# Patient Record
Sex: Male | Born: 1960 | Race: White | Hispanic: No | Marital: Married | Smoking: Former smoker
Health system: Southern US, Community
[De-identification: ages and names within clinical notes are randomized; demographics above are authoritative.]

## PROBLEM LIST (undated history)

## (undated) DIAGNOSIS — M1990 Unspecified osteoarthritis, unspecified site: Secondary | ICD-10-CM

## (undated) DIAGNOSIS — J45909 Unspecified asthma, uncomplicated: Secondary | ICD-10-CM

## (undated) DIAGNOSIS — G4730 Sleep apnea, unspecified: Secondary | ICD-10-CM

## (undated) DIAGNOSIS — I10 Essential (primary) hypertension: Secondary | ICD-10-CM

## (undated) DIAGNOSIS — R0681 Apnea, not elsewhere classified: Secondary | ICD-10-CM

## (undated) HISTORY — DX: Unspecified asthma, uncomplicated: J45.909

## (undated) HISTORY — PX: REPLACEMENT TOTAL HIP W/  RESURFACING IMPLANTS: SUR1222

## (undated) HISTORY — DX: Essential (primary) hypertension: I10

## (undated) HISTORY — DX: Apnea, not elsewhere classified: R06.81

## (undated) HISTORY — PX: OTHER SURGICAL HISTORY: SHX169

## (undated) HISTORY — PX: HERNIA REPAIR: SHX51

---

## 2017-02-16 ENCOUNTER — Other Ambulatory Visit (HOSPITAL_COMMUNITY): Payer: Self-pay | Admitting: General Surgery

## 2017-03-01 ENCOUNTER — Encounter: Payer: Managed Care, Other (non HMO) | Attending: General Surgery | Admitting: Skilled Nursing Facility1

## 2017-03-01 ENCOUNTER — Encounter: Payer: Self-pay | Admitting: Skilled Nursing Facility1

## 2017-03-01 DIAGNOSIS — Z713 Dietary counseling and surveillance: Secondary | ICD-10-CM | POA: Diagnosis present

## 2017-03-01 DIAGNOSIS — E669 Obesity, unspecified: Secondary | ICD-10-CM

## 2017-03-01 NOTE — Progress Notes (Addendum)
Pre-Op Assessment Visit:  Pre-Operative Sleeve Gastrectomy Surgery  Medical Nutrition Therapy:  Appt start time: 2:48  End time:  4:03  Patient was seen on 03/01/2017 for Pre-Operative Nutrition Assessment. Assessment and letter of approval [facility] coordinator on 03/01/2017.   Pt is keeping up with the baritastic app. Pt states he does not eat fruits or vegetables. Pt states he needs 3 SWL.  Pt expectation of surgery: To lose wt and feel better  Pt expectation of Dietitian: To keep me straight   Start weight at [facility]: 390.2 BMI: 52.92  24 hr Dietary Recall: First Meal: protein shake Snack:  Second Meal: protein bar, cheese crackers, protein shake Snack: cheese crackers  Third Meal: fast food Snack: protein bar Beverages: coffee, protein shake, V8 vegetable juice, sweet tea, grape juice  Encouraged to engage in 150 minutes of moderate physical activity including cardiovascular and weight baring weekly  Handouts given during visit include:  . Pre-Op Goals . Bariatric Surgery Protein Shakes During the appointment today the following Pre-Op Goals were reviewed with the patient: . Maintain or lose weight as instructed by your surgeon . Make healthy food choices . Begin to limit portion sizes . Limited concentrated sugars and fried foods . Keep fat/sugar in the single digits per serving on             food labels . Practice CHEWING your food  (aim for 30 chews per bite or until applesauce consistency) . Practice not drinking 15 minutes before, during, and 30 minutes after each meal/snack . Avoid all carbonated beverages  . Avoid/limit caffeinated beverages  . Avoid all sugar-sweetened beverages . Consume 3 meals per day; eat every 3-5 hours . Make a list of non-food related activities . Aim for 64-100 ounces of FLUID daily  . Aim for at least 60-80 grams of PROTEIN daily . Look for a liquid protein source that contain ?15 g  protein and ?5 g carbohydrate  (ex: shakes, drinks, shots) . Eat shredded carrots once a week  -Follow diet recommendations listed below   Energy and Macronutrient Recomendations: Calories: 2000 Carbohydrate: 225 Protein: 150 Fat: 56  Demonstrated degree of understanding via:  Teach Back  Teaching Method Utilized:  Visual Auditory Hands on  Barriers to learning/adherence to lifestyle change: none identified   Patient to call the [facility] to enroll in Pre-Op and Post-Op Nutrition Education when surgery date is scheduled.

## 2017-03-23 ENCOUNTER — Ambulatory Visit (HOSPITAL_COMMUNITY)
Admission: RE | Admit: 2017-03-23 | Discharge: 2017-03-23 | Disposition: A | Discharge status: Home / Self Care | Payer: Managed Care, Other (non HMO) | Source: Ambulatory Visit | Attending: General Surgery | Admitting: General Surgery

## 2017-03-23 ENCOUNTER — Other Ambulatory Visit: Payer: Self-pay

## 2017-03-23 DIAGNOSIS — R001 Bradycardia, unspecified: Secondary | ICD-10-CM | POA: Insufficient documentation

## 2017-03-28 ENCOUNTER — Encounter: Payer: Managed Care, Other (non HMO) | Attending: General Surgery | Admitting: Skilled Nursing Facility1

## 2017-03-28 ENCOUNTER — Encounter: Payer: Self-pay | Admitting: Skilled Nursing Facility1

## 2017-03-28 DIAGNOSIS — Z713 Dietary counseling and surveillance: Secondary | ICD-10-CM | POA: Insufficient documentation

## 2017-03-28 DIAGNOSIS — E669 Obesity, unspecified: Secondary | ICD-10-CM

## 2017-03-28 NOTE — Patient Instructions (Addendum)
-  Chew until applesauce consistency: try putting your fork down in between bites  -Try more of your melon, grapes, and carrots  -Do not get anything fried   -Work on the meal idea sheet with your wife

## 2017-03-28 NOTE — Progress Notes (Signed)
Sleeve Assessment:   1st SWL Appointment.   Pt is keeping up with the baritastic app. Pt states he does not eat fruits or vegetables. Pt states he needs 3 SWL.  Pt states he tried watermelon, carrots, cantelope, honeydew, grapes, greek yogurt. Pt states he bought premier protein and will try it. Pt states he was not able to exercise because he was busy.   Start weight at [facility]: 390.2 Wt: 395 BMI: 53.57  MEDICATIONS: See List   DIETARY INTAKE:  24-hr recall:  First Meal 5am: protein shake Snack: 2 poptarts Second Meal: protein bar, cheese crackers, protein shake, almonds Snack: cheese crackers  Third Meal: fast food Snack: protein bar Beverages: coffee, protein shake, V8 vegetable juice, sweet tea, grape juice  Usual physical activity: ADL's  Diet to Follow: 1800 calories 200 g carbohydrates 135 g protein 50 g fat   Nutritional Diagnosis:  [place]-3.3 Overweight/obesity related to past poor dietary habits and physical inactivity as evidenced by patient w/ planned Sleeve surgery following dietary guidelines for continued weight loss.    Intervention:  Nutrition counseling for upcoming Bariatric Surgery. Goals: -Encouraged to engage in 150 minutes of moderate physical activity including cardiovascular and weight baring weekly -Chew until applesauce consistency: try putting your fork down in between bites -Try more of your melon, grapes, and carrots -Do not get anything fried  -Work on the meal idea sheet with your wife  Teaching Method Utilized:  Visual Auditory Hands on  Handouts given during visit include:  Meal ideas  Barriers to learning/adherence to lifestyle change: none identified   Demonstrated degree of understanding via:  Teach Back   Monitoring/Evaluation:  Dietary intake, exercise, and body weight prn.

## 2017-04-24 ENCOUNTER — Ambulatory Visit (INDEPENDENT_AMBULATORY_CARE_PROVIDER_SITE_OTHER): Payer: 59 | Admitting: Psychiatry

## 2017-04-24 DIAGNOSIS — F509 Eating disorder, unspecified: Secondary | ICD-10-CM | POA: Diagnosis not present

## 2017-04-26 ENCOUNTER — Encounter: Payer: Self-pay | Admitting: Skilled Nursing Facility1

## 2017-04-26 ENCOUNTER — Encounter: Payer: Managed Care, Other (non HMO) | Attending: General Surgery | Admitting: Skilled Nursing Facility1

## 2017-04-26 DIAGNOSIS — Z713 Dietary counseling and surveillance: Secondary | ICD-10-CM | POA: Insufficient documentation

## 2017-04-26 DIAGNOSIS — E669 Obesity, unspecified: Secondary | ICD-10-CM

## 2017-04-26 NOTE — Progress Notes (Signed)
Sleeve Assessment:   2nd SWL Appointment.   Pt is keeping up with the baritastic app. Pt states he does not eat fruits or vegetables. Pt states he needs 3 SWL.  Pt arrives having lost about 5 pounds. Pt states he thinks he will eat more oranges pt thinks his wife will be making him try apples soon. Pt states he has been eating some carrots every now and then. Pt states chewing has been better but still not accomplished. Pt states he no longer needs another SWL appointment.   Start weight at [facility]: 390.2 Wt: 390 BMI: 52.89  MEDICATIONS: See List   DIETARY INTAKE:  24-hr recall:  First Meal 5am: protein shake and granol bar Snack: greek yogurt and crackers Second Meal: protein bar, cheese crackers, protein shake, almonds----half a turkey sub and chips and cookie [place] grapes  Snack: cheese crackers  Third Meal: chicken strips french fries and hush puppies  Snack: protein bar Beverages: coffee, protein shake, V8 vegetable juice, sweet tea, grape juice  Usual physical activity: ADL's  Diet to Follow: 1800 calories 200 g carbohydrates 135 g protein 50 g fat   Nutritional Diagnosis:  [place]-3.3 Overweight/obesity related to past poor dietary habits and physical inactivity as evidenced by patient w/ planned Sleeve surgery following dietary guidelines for continued weight loss.    Intervention:  Nutrition counseling for upcoming Bariatric Surgery. Goals: -Keep working on Chewing until applesauce consistency: try putting your fork down in between bites -Keep up the great work with trying new foods! -Do not get anything fried  -Work on the meal idea sheet with your wife -Keep working on not drinking with meals  Teaching Method Utilized:  Scientific laboratory technicianVisual Auditory Hands on  Handouts given during visit include:  Meal ideas  Barriers to learning/adherence to lifestyle change: none identified   Demonstrated degree of understanding via:  Teach Back   Monitoring/Evaluation:  Dietary intake,  exercise, and body weight prn.

## 2017-04-26 NOTE — Patient Instructions (Addendum)
-  Keep working on Chewing until applesauce consistency: try putting your fork down in between bites  -Keep up the great work with trying new foods!  -Do not get anything fried   -Work on the meal idea sheet with your wife  -Keep working on not drinking with meals

## 2017-05-28 ENCOUNTER — Ambulatory Visit: Payer: Self-pay

## 2017-05-28 ENCOUNTER — Encounter: Payer: Self-pay | Admitting: Registered"

## 2017-05-28 ENCOUNTER — Encounter: Payer: Managed Care, Other (non HMO) | Admitting: Skilled Nursing Facility1

## 2017-05-28 ENCOUNTER — Encounter: Payer: Managed Care, Other (non HMO) | Attending: General Surgery | Admitting: Registered"

## 2017-05-28 DIAGNOSIS — E669 Obesity, unspecified: Secondary | ICD-10-CM

## 2017-05-28 DIAGNOSIS — Z713 Dietary counseling and surveillance: Secondary | ICD-10-CM | POA: Diagnosis not present

## 2017-05-28 NOTE — Progress Notes (Signed)
Sleeve Assessment: 3rd SWL Appointment.   Pt is keeping up with the baritastic app. Pt states he does not eat fruits or vegetables. Pt states he needs 3 SWL.  Pt arrives having gained about 4 pounds. Pt states he is doing good most of the time with trying not to eat fried foods. Pt states he is still working on chewing well, forgets sometimes when eating in the truck before work.  Pt states he is doing better with not drinking with meals. Pt states his surgery date 12/18.   Pt states he thinks he will eat more oranges pt thinks his wife will be making him try apples soon. Pt states he has been eating some carrots every now and then. Pt states chewing has been better but still not accomplished. Pt states he no longer needs another SWL appointment.   Start weight at [facility]: 390.2 Wt: 394.2 BMI: 53.46  MEDICATIONS: See List   DIETARY INTAKE:  24-hr recall:  First Meal 5am: protein shake and granol bar Snack: greek yogurt and crackers Second Meal: protein bar, cheese crackers, protein shake, almonds----half a [place] sub and chips and cookie and grapes  Snack: cheese crackers  Third Meal: chicken strips french fries and hush puppies  Snack: protein bar Beverages: decaf coffee, protein shake, V8 vegetable juice, sweet tea, grape juice  Usual physical activity: ADL's, swimming 60 min, 3x/week  Diet to Follow: 1800 calories 200 g carbohydrates 135 g protein 50 g fat   Nutritional Diagnosis:  -3.3 Overweight/obesity related to past poor dietary habits and physical inactivity as evidenced by patient w/ planned Sleeve surgery following dietary guidelines for continued weight loss.    Intervention:  Nutrition counseling for upcoming Bariatric Surgery. Goals: - Continue to work on chewing at least 30 times per bite or applesauce consistency.  - Keep working on not drinking with meals.  - Take multivitamin complete with iron and Vitamin D 5000 IU.   Teaching Method Utilized:   Visual Auditory Hands on  Handouts given during visit include:  Vitamin and Mineral Recommendations  Barriers to learning/adherence to lifestyle change: none identified   Demonstrated degree of understanding via:  Teach Back   Monitoring/Evaluation:  Dietary intake, exercise, and body weight prn.

## 2017-05-28 NOTE — Patient Instructions (Addendum)
-   Continue to work on chewing at least 30 times per bite or applesauce consistency.   - Keep working on not drinking with meals.   - Take multivitamin complete with iron and Vitamin D 5000 IU.

## 2017-05-29 ENCOUNTER — Encounter: Payer: Self-pay | Admitting: Skilled Nursing Facility1

## 2017-05-29 NOTE — Progress Notes (Signed)
Pre-Operative Nutrition Class:  Appt start time: 2111   End time:  1830.  Patient was seen on 05/28/2017 for Pre-Operative Bariatric Surgery Education [facility].   Surgery date:  Surgery type: Sleeve Start weight at [facility]: 390 Weight today: 394  Samples given per MNT protocol. Patient educated on appropriate usage: Bariatric Advantage Multivitamin Lot # [id number] Exp: 07/19  Bariatric Advantage Calcium  Lot # [id number] Exp: 04-05-2018  Unjury Protein  Shake Lot # [id number] Exp: may-28-19  The following the learning objectives were met by the patient during this course:  Identify Pre-Op Dietary Goals and will begin 2 weeks pre-operatively  Identify appropriate sources of fluids and proteins   State protein recommendations and appropriate sources pre and post-operatively  Identify Post-Operative Dietary Goals and will follow for 2 weeks post-operatively  Identify appropriate multivitamin and calcium sources  Describe the need for physical activity post-operatively and will follow MD recommendations  State when to call healthcare provider regarding medication questions or post-operative complications  Handouts given during class include:  Pre-Op Bariatric Surgery Diet Handout  Protein Shake Handout  Post-Op Bariatric Surgery Nutrition Handout  BELT Program Information Flyer  Support Group Information Flyer  [facility] Bariatric Supplements Price List  Follow[facility]Up Plan: [facility] diet [facility] per MD.

## 2017-05-31 ENCOUNTER — Ambulatory Visit (INDEPENDENT_AMBULATORY_CARE_PROVIDER_SITE_OTHER): Payer: 59 | Admitting: Psychiatry

## 2017-05-31 DIAGNOSIS — F509 Eating disorder, unspecified: Secondary | ICD-10-CM

## 2017-06-06 ENCOUNTER — Telehealth: Payer: Self-pay | Admitting: Skilled Nursing Facility1

## 2017-06-06 NOTE — Telephone Encounter (Signed)
Pt had questions about the vitamins and when to get them.  Dietitian advised he go ahead and buy them now so he will have them for after surgery.

## 2017-06-14 ENCOUNTER — Telehealth: Payer: Self-pay | Admitting: Skilled Nursing Facility1

## 2017-06-14 NOTE — Telephone Encounter (Signed)
No that one does not have enough of what you need. Really, no multivitamin from [facility] is going to have what you need. Your best option is to get the ones from your list online.   From: Lajean Manesoger Schroll [email or web address]  Sent: Thursday, June 14, 2017 3:39 PM To: Demario [email or web address]> Subject: [External Email]Bariatric vitamins  *Caution - External Email*  Just checking to see if these vitamins would be good for my bariatric Vitamins             Sent from my iPhone

## 2017-06-15 NOTE — Progress Notes (Signed)
Please place orders in epic pt. Has a preop 06-18-17 @ 1100 am thank you!

## 2017-06-15 NOTE — Patient Instructions (Addendum)
Omar ManesRoger Wang  06/15/2017   Your procedure is scheduled on: 06-19-17  [facility]  Entrance [facility] [place]  elevators to 3rd floor to  Short Stay Center at      1215 PM.    Call this number if you have problems the morning of surgery [phone]    Remember: ONLY 1 PERSON MAY GO WITH YOU TO SHORT STAY TO GET  READY MORNING OF YOUR SURGERY.   Do not eat food:After Midnight.  YOU MAY HAVE CLEAR LIQUIDS UNTIL 815 THEN NOTHING BY MOUTH     Take these medicines the morning of surgery with A SIP OF WATER: INHALER AND BRING                               You may not have any metal on your body including hair pins and              piercings  Do not wear jewelry,lotions, powders or perfumes, deodorant     .              Men may shave face and neck.   Do not bring valuables to the hospital. [facility] IS NOT             RESPONSIBLE   FOR VALUABLES.  Contacts, dentures or bridgework may not be worn into surgery.  Leave suitcase in the car. After surgery it may be brought to your room.               Please read over the following fact sheets you were given: _____________________________________________________________________           [facility] - Pre[facility] Before surgery, you can play an important role.  Because skin is not sterile, your skin needs to be as free of germs as possible.  You can reduce the number of germs on your skin by washing with CHG (chlorahexidine gluconate) soap before surgery.  CHG is an antiseptic cleaner which kills germs and bonds with the skin to continue killing germs even after washing. Please DO NOT use if you have an allergy to CHG or antibacterial soaps.  If your skin becomes reddened/irritated stop using the CHG and inform your nurse when you arrive at Short Stay. Do not shave (including legs and underarms) for at least 48 hours prior to the first CHG shower.  You may shave your face/neck. Please follow these  instructions carefully:  1.  Shower with CHG Soap the night before surgery and the  morning of Surgery.  2.  If you choose to wash your hair, wash your hair first as usual with your  normal  shampoo.  3.  After you shampoo, rinse your hair and body thoroughly to remove the  shampoo.                           4.  Use CHG as you would any other liquid soap.  You can apply chg directly  to the skin and wash                       Gently with a scrungie or clean washcloth.  5.  Apply the CHG Soap to your body ONLY FROM THE NECK DOWN.   Do not  use on face/ open                           Wound or open sores. Avoid contact with eyes, ears mouth and genitals (private parts).                       Wash face,  Genitals (private parts) with your normal soap.             6.  Wash thoroughly, paying special attention to the area where your surgery  will be performed.  7.  Thoroughly rinse your body with warm water from the neck down.  8.  DO NOT shower/wash with your normal soap after using and rinsing off  the CHG Soap.                9.  Pat yourself dry with a clean towel.            10.  Wear clean pajamas.            11.  Place clean sheets on your bed the night of your first shower and do not  sleep with pets. Day of Surgery : Do not apply any lotions/deodorants the morning of surgery.  Please wear clean clothes to the hospital/surgery center.  FAILURE TO FOLLOW THESE INSTRUCTIONS MAY RESULT IN THE CANCELLATION OF YOUR SURGERY PATIENT SIGNATURE_________________________________  NURSE SIGNATURE__________________________________  ________________________________________________________________________    CLEAR LIQUID DIET   Foods Allowed                                                                     Foods Excluded  Coffee and tea, regular and decaf                             liquids that you cannot  Plain Jell-O in any flavor                                             see through such  as: Fruit ices (not with fruit pulp)                                     milk, soups, orange juice  Iced Popsicles                                    All solid food Carbonated beverages, regular and diet                                    Cranberry, grape and apple juices Sports drinks like Gatorade Lightly seasoned clear broth or consume(fat free) Sugar, honey syrup  Sample Menu Breakfast  Lunch                                     Supper Cranberry juice                    Beef broth                            Chicken broth Jell-O                                     Grape juice                           Apple juice Coffee or tea                        Jell-O                                      Popsicle                                                Coffee or tea                        Coffee or tea  _____________________________________________________________________

## 2017-06-18 ENCOUNTER — Encounter (HOSPITAL_COMMUNITY)
Admission: RE | Admit: 2017-06-18 | Discharge: 2017-06-18 | Disposition: A | Discharge status: Home / Self Care | Payer: Managed Care, Other (non HMO) | Source: Ambulatory Visit | Attending: General Surgery | Admitting: General Surgery

## 2017-06-18 ENCOUNTER — Other Ambulatory Visit: Payer: Self-pay

## 2017-06-18 ENCOUNTER — Encounter (HOSPITAL_COMMUNITY): Payer: Self-pay

## 2017-06-18 HISTORY — DX: Sleep apnea, unspecified: G47.30

## 2017-06-18 HISTORY — DX: Unspecified osteoarthritis, unspecified site: M19.90

## 2017-06-18 NOTE — Progress Notes (Signed)
ekg 03-23-17 epic  Cbc/diff 05-31-17 cmp 05-31-17, hgba1c 05-31-17 epic   cxr 03-23-17

## 2017-06-18 NOTE — Progress Notes (Signed)
Requested bari bed for 06-20-17 @1215  ss 3rd floor

## 2017-06-19 ENCOUNTER — Inpatient Hospital Stay (HOSPITAL_COMMUNITY): Payer: Managed Care, Other (non HMO) | Admitting: Anesthesiology

## 2017-06-19 ENCOUNTER — Encounter (HOSPITAL_COMMUNITY): Payer: Self-pay | Admitting: *Deleted

## 2017-06-19 ENCOUNTER — Inpatient Hospital Stay (HOSPITAL_COMMUNITY)
Admission: RE | Admit: 2017-06-19 | Discharge: 2017-06-20 | DRG: 621 | Disposition: A | Discharge status: Home / Self Care | Payer: Managed Care, Other (non HMO) | Source: Ambulatory Visit | Attending: General Surgery | Admitting: General Surgery

## 2017-06-19 ENCOUNTER — Encounter (HOSPITAL_COMMUNITY)
Admission: RE | Disposition: A | Discharge status: Home / Self Care | Payer: Self-pay | Source: Ambulatory Visit | Attending: General Surgery

## 2017-06-19 DIAGNOSIS — Z01811 Encounter for preprocedural respiratory examination: Secondary | ICD-10-CM | POA: Diagnosis not present

## 2017-06-19 DIAGNOSIS — Z96643 Presence of artificial hip joint, bilateral: Secondary | ICD-10-CM | POA: Diagnosis present

## 2017-06-19 DIAGNOSIS — Z7951 Long term (current) use of inhaled steroids: Secondary | ICD-10-CM

## 2017-06-19 DIAGNOSIS — Z6841 Body Mass Index (BMI) 40.0 and over, adult: Secondary | ICD-10-CM

## 2017-06-19 DIAGNOSIS — I10 Essential (primary) hypertension: Secondary | ICD-10-CM | POA: Diagnosis present

## 2017-06-19 DIAGNOSIS — Z885 Allergy status to narcotic agent status: Secondary | ICD-10-CM

## 2017-06-19 DIAGNOSIS — G4730 Sleep apnea, unspecified: Secondary | ICD-10-CM | POA: Diagnosis present

## 2017-06-19 DIAGNOSIS — Z87891 Personal history of nicotine dependence: Secondary | ICD-10-CM | POA: Diagnosis not present

## 2017-06-19 DIAGNOSIS — K660 Peritoneal adhesions (postprocedural) (postinfection): Secondary | ICD-10-CM | POA: Diagnosis present

## 2017-06-19 DIAGNOSIS — J45909 Unspecified asthma, uncomplicated: Secondary | ICD-10-CM | POA: Diagnosis present

## 2017-06-19 HISTORY — PX: LAPAROSCOPIC GASTRIC SLEEVE RESECTION: SHX5895

## 2017-06-19 LAB — COMPREHENSIVE METABOLIC PANEL
ALT: 69 U/L — ABNORMAL HIGH (ref 17–63)
ANION GAP: 9 (ref 5–15)
AST: 45 U/L — ABNORMAL HIGH (ref 15–41)
Albumin: 4.4 g/dL (ref 3.5–5.0)
Alkaline Phosphatase: 82 U/L (ref 38–126)
BUN: 16 mg/dL (ref 6–20)
CHLORIDE: 103 mmol/L (ref 101–111)
CO2: 24 mmol/L (ref 22–32)
CREATININE: 0.87 mg/dL (ref 0.61–1.24)
Calcium: 9.4 mg/dL (ref 8.9–10.3)
Glucose, Bld: 89 mg/dL (ref 65–99)
POTASSIUM: 4 mmol/L (ref 3.5–5.1)
Sodium: 136 mmol/L (ref 135–145)
Total Bilirubin: 1.4 mg/dL — ABNORMAL HIGH (ref 0.3–1.2)
Total Protein: 7.5 g/dL (ref 6.5–8.1)

## 2017-06-19 LAB — CBC WITH DIFFERENTIAL/PLATELET
BASOS ABS: 0 10*3/uL (ref 0.0–0.1)
BASOS PCT: 0 %
EOS PCT: 3 %
Eosinophils Absolute: 0.2 10*3/uL (ref 0.0–0.7)
HCT: 45.1 % (ref 39.0–52.0)
Hemoglobin: 15.1 g/dL (ref 13.0–17.0)
Lymphocytes Relative: 24 %
Lymphs Abs: 1.3 10*3/uL (ref 0.7–4.0)
MCH: 30.9 pg (ref 26.0–34.0)
MCHC: 33.5 g/dL (ref 30.0–36.0)
MCV: 92.4 fL (ref 78.0–100.0)
MONO ABS: 0.5 10*3/uL (ref 0.1–1.0)
Monocytes Relative: 10 %
Neutro Abs: 3.5 10*3/uL (ref 1.7–7.7)
Neutrophils Relative %: 63 %
PLATELETS: 170 10*3/uL (ref 150–400)
RBC: 4.88 MIL/uL (ref 4.22–5.81)
RDW: 13.4 % (ref 11.5–15.5)
WBC: 5.5 10*3/uL (ref 4.0–10.5)

## 2017-06-19 LAB — TYPE AND SCREEN
ABO/RH(D): A POS
ANTIBODY SCREEN: NEGATIVE

## 2017-06-19 LAB — HEMOGLOBIN AND HEMATOCRIT, BLOOD
HEMATOCRIT: 43.5 % (ref 39.0–52.0)
HEMOGLOBIN: 14.3 g/dL (ref 13.0–17.0)

## 2017-06-19 LAB — GLUCOSE, CAPILLARY: GLUCOSE-CAPILLARY: 82 mg/dL (ref 65–99)

## 2017-06-19 LAB — ABO/RH: ABO/RH(D): A POS

## 2017-06-19 SURGERY — GASTRECTOMY, SLEEVE, LAPAROSCOPIC
Anesthesia: General | Site: Abdomen

## 2017-06-19 MED ORDER — ACETAMINOPHEN 500 MG PO TABS
1000.0000 mg | ORAL_TABLET | ORAL | Status: AC
  Administered 2017-06-19: 1000 mg via ORAL
  Filled 2017-06-19: qty 2

## 2017-06-19 MED ORDER — GABAPENTIN 300 MG PO CAPS
300.0000 mg | ORAL_CAPSULE | ORAL | Status: AC
  Administered 2017-06-19: 300 mg via ORAL
  Filled 2017-06-19: qty 1

## 2017-06-19 MED ORDER — PANTOPRAZOLE SODIUM 40 MG IV SOLR
40.0000 mg | Freq: Every day | INTRAVENOUS | Status: DC
  Administered 2017-06-19: 40 mg via INTRAVENOUS
  Filled 2017-06-19: qty 40

## 2017-06-19 MED ORDER — CHLORHEXIDINE GLUCONATE 4 % EX LIQD
60.0000 mL | Freq: Once | CUTANEOUS | Status: AC
  Administered 2017-06-19: 4 via TOPICAL

## 2017-06-19 MED ORDER — SODIUM CHLORIDE 0.9 % IV SOLN
INTRAVENOUS | Status: DC
  Administered 2017-06-19 – 2017-06-20 (×2): via INTRAVENOUS

## 2017-06-19 MED ORDER — FENTANYL CITRATE (PF) 100 MCG/2ML IJ SOLN
INTRAMUSCULAR | Status: DC | PRN
  Administered 2017-06-19: 100 ug via INTRAVENOUS
  Administered 2017-06-19: 50 ug via INTRAVENOUS
  Administered 2017-06-19: 100 ug via INTRAVENOUS

## 2017-06-19 MED ORDER — ONDANSETRON HCL 4 MG/2ML IJ SOLN
4.0000 mg | INTRAMUSCULAR | Status: DC | PRN
Start: 2017-06-19 — End: 2017-06-20

## 2017-06-19 MED ORDER — PROMETHAZINE HCL 25 MG/ML IJ SOLN
INTRAMUSCULAR | Status: AC
  Filled 2017-06-19: qty 1

## 2017-06-19 MED ORDER — BUPIVACAINE-EPINEPHRINE (PF) 0.25% -1:200000 IJ SOLN
INTRAMUSCULAR | Status: AC
  Filled 2017-06-19: qty 30

## 2017-06-19 MED ORDER — TRAMADOL HCL 50 MG PO TABS: 50.0000 mg | ORAL_TABLET | Freq: Four times a day (QID) | ORAL | Status: DC | PRN

## 2017-06-19 MED ORDER — BUPIVACAINE LIPOSOME 1.3 % IJ SUSP
20.0000 mL | Freq: Once | INTRAMUSCULAR | Status: AC
  Administered 2017-06-19: 20 mL
  Filled 2017-06-19: qty 20

## 2017-06-19 MED ORDER — MIDAZOLAM HCL 5 MG/5ML IJ SOLN
INTRAMUSCULAR | Status: DC | PRN
  Administered 2017-06-19: 2 mg via INTRAVENOUS

## 2017-06-19 MED ORDER — BUDESONIDE 0.25 MG/2ML IN SUSP
0.2500 mg | Freq: Two times a day (BID) | RESPIRATORY_TRACT | Status: DC
  Administered 2017-06-19 – 2017-06-20 (×2): 0.25 mg via RESPIRATORY_TRACT
  Filled 2017-06-19 (×2): qty 2

## 2017-06-19 MED ORDER — LIDOCAINE 2% (20 MG/ML) 5 ML SYRINGE
INTRAMUSCULAR | Status: AC
  Filled 2017-06-19: qty 5

## 2017-06-19 MED ORDER — PROPOFOL 10 MG/ML IV BOLUS
INTRAVENOUS | Status: DC | PRN
  Administered 2017-06-19: 160 mg via INTRAVENOUS

## 2017-06-19 MED ORDER — PROMETHAZINE HCL 25 MG/ML IJ SOLN
6.2500 mg | INTRAMUSCULAR | Status: AC | PRN
  Administered 2017-06-19 (×2): 12.5 mg via INTRAVENOUS

## 2017-06-19 MED ORDER — MEPERIDINE HCL 50 MG/ML IJ SOLN: 6.2500 mg | INTRAMUSCULAR | Status: DC | PRN

## 2017-06-19 MED ORDER — FENTANYL CITRATE (PF) 250 MCG/5ML IJ SOLN
INTRAMUSCULAR | Status: AC
  Filled 2017-06-19: qty 5

## 2017-06-19 MED ORDER — CHLORHEXIDINE GLUCONATE 4 % EX LIQD
60.0000 mL | Freq: Once | CUTANEOUS | Status: DC
Start: 2017-06-20 — End: 2017-06-19

## 2017-06-19 MED ORDER — PROPOFOL 10 MG/ML IV BOLUS
INTRAVENOUS | Status: AC
  Filled 2017-06-19: qty 20

## 2017-06-19 MED ORDER — LACTATED RINGERS IR SOLN
Status: DC | PRN
  Administered 2017-06-19: 1000 mL

## 2017-06-19 MED ORDER — APREPITANT 40 MG PO CAPS
40.0000 mg | ORAL_CAPSULE | ORAL | Status: AC
  Administered 2017-06-19: 40 mg via ORAL
  Filled 2017-06-19: qty 1

## 2017-06-19 MED ORDER — HYDROMORPHONE HCL 1 MG/ML IJ SOLN
INTRAMUSCULAR | Status: AC
  Filled 2017-06-19: qty 2

## 2017-06-19 MED ORDER — ACETAMINOPHEN 160 MG/5ML PO SOLN
650.0000 mg | Freq: Four times a day (QID) | ORAL | Status: DC
  Administered 2017-06-20 (×3): 650 mg via ORAL
  Filled 2017-06-19 (×3): qty 20.3

## 2017-06-19 MED ORDER — HEPARIN SODIUM (PORCINE) 5000 UNIT/ML IJ SOLN
5000.0000 [IU] | INTRAMUSCULAR | Status: AC
  Administered 2017-06-19: 5000 [IU] via SUBCUTANEOUS
  Filled 2017-06-19: qty 1

## 2017-06-19 MED ORDER — ROCURONIUM BROMIDE 50 MG/5ML IV SOSY
PREFILLED_SYRINGE | INTRAVENOUS | Status: AC
  Filled 2017-06-19: qty 5

## 2017-06-19 MED ORDER — DEXAMETHASONE SODIUM PHOSPHATE 10 MG/ML IJ SOLN
INTRAMUSCULAR | Status: DC | PRN
  Administered 2017-06-19: 10 mg via INTRAVENOUS

## 2017-06-19 MED ORDER — HYDRALAZINE HCL 20 MG/ML IJ SOLN: 10.0000 mg | INTRAMUSCULAR | Status: DC | PRN

## 2017-06-19 MED ORDER — CEFOTETAN DISODIUM-DEXTROSE 2-2.08 GM-%(50ML) IV SOLR
2.0000 g | INTRAVENOUS | Status: AC
  Administered 2017-06-19: 2 g via INTRAVENOUS
  Filled 2017-06-19: qty 50

## 2017-06-19 MED ORDER — STERILE WATER FOR IRRIGATION IR SOLN
Status: DC | PRN
  Administered 2017-06-19: 2000 mL

## 2017-06-19 MED ORDER — ENOXAPARIN SODIUM 30 MG/0.3ML ~~LOC~~ SOLN
30.0000 mg | Freq: Two times a day (BID) | SUBCUTANEOUS | Status: DC
  Administered 2017-06-19 – 2017-06-20 (×2): 30 mg via SUBCUTANEOUS
  Filled 2017-06-19 (×2): qty 0.3

## 2017-06-19 MED ORDER — DEXAMETHASONE SODIUM PHOSPHATE 4 MG/ML IJ SOLN: 4.0000 mg | INTRAMUSCULAR | Status: DC

## 2017-06-19 MED ORDER — KETOROLAC TROMETHAMINE 30 MG/ML IJ SOLN: 30.0000 mg | Freq: Once | INTRAMUSCULAR | Status: DC | PRN

## 2017-06-19 MED ORDER — OXYCODONE HCL 5 MG/5ML PO SOLN: 5.0000 mg | ORAL | Status: DC | PRN

## 2017-06-19 MED ORDER — SUGAMMADEX SODIUM 200 MG/2ML IV SOLN
INTRAVENOUS | Status: AC
  Filled 2017-06-19: qty 4

## 2017-06-19 MED ORDER — SIMETHICONE 80 MG PO CHEW: 80.0000 mg | CHEWABLE_TABLET | Freq: Four times a day (QID) | ORAL | Status: DC | PRN

## 2017-06-19 MED ORDER — HYDROMORPHONE HCL 1 MG/ML IJ SOLN
0.2500 mg | INTRAMUSCULAR | Status: DC | PRN
  Administered 2017-06-19 (×4): 0.5 mg via INTRAVENOUS

## 2017-06-19 MED ORDER — GABAPENTIN 250 MG/5ML PO SOLN
200.0000 mg | Freq: Two times a day (BID) | ORAL | Status: DC
  Administered 2017-06-19 – 2017-06-20 (×2): 200 mg via ORAL
  Filled 2017-06-19 (×3): qty 4

## 2017-06-19 MED ORDER — MORPHINE SULFATE (PF) 2 MG/ML IV SOLN
1.0000 mg | INTRAVENOUS | Status: DC | PRN
  Administered 2017-06-19: 3 mg via INTRAVENOUS
  Filled 2017-06-19: qty 2

## 2017-06-19 MED ORDER — PREMIER PROTEIN SHAKE
2.0000 [oz_av] | ORAL | Status: DC
  Administered 2017-06-20 (×3): 2 [oz_av] via ORAL

## 2017-06-19 MED ORDER — ROCURONIUM BROMIDE 50 MG/5ML IV SOSY
PREFILLED_SYRINGE | INTRAVENOUS | Status: AC
  Filled 2017-06-19: qty 10

## 2017-06-19 MED ORDER — ONDANSETRON HCL 4 MG/2ML IJ SOLN
INTRAMUSCULAR | Status: DC | PRN
  Administered 2017-06-19: 4 mg via INTRAVENOUS

## 2017-06-19 MED ORDER — 0.9 % SODIUM CHLORIDE (POUR BTL) OPTIME
TOPICAL | Status: DC | PRN
  Administered 2017-06-19: 1000 mL

## 2017-06-19 MED ORDER — MIDAZOLAM HCL 2 MG/2ML IJ SOLN
INTRAMUSCULAR | Status: AC
  Filled 2017-06-19: qty 2

## 2017-06-19 MED ORDER — LACTATED RINGERS IV SOLN
INTRAVENOUS | Status: DC
  Administered 2017-06-19 (×3): via INTRAVENOUS

## 2017-06-19 MED ORDER — SUGAMMADEX SODIUM 200 MG/2ML IV SOLN
INTRAVENOUS | Status: DC | PRN
  Administered 2017-06-19: 400 mg via INTRAVENOUS

## 2017-06-19 MED ORDER — BUPIVACAINE-EPINEPHRINE (PF) 0.25% -1:200000 IJ SOLN
INTRAMUSCULAR | Status: DC | PRN
  Administered 2017-06-19: 30 mL

## 2017-06-19 MED ORDER — LIDOCAINE 2% (20 MG/ML) 5 ML SYRINGE
INTRAMUSCULAR | Status: DC | PRN
  Administered 2017-06-19: 1.5 mg/kg/h via INTRAVENOUS

## 2017-06-19 MED ORDER — DEXAMETHASONE SODIUM PHOSPHATE 10 MG/ML IJ SOLN
INTRAMUSCULAR | Status: AC
  Filled 2017-06-19: qty 1

## 2017-06-19 MED ORDER — ROCURONIUM BROMIDE 100 MG/10ML IV SOLN
INTRAVENOUS | Status: DC | PRN
  Administered 2017-06-19 (×3): 20 mg via INTRAVENOUS
  Administered 2017-06-19: 50 mg via INTRAVENOUS

## 2017-06-19 MED ORDER — SCOPOLAMINE 1 MG/3DAYS TD PT72
1.0000 | MEDICATED_PATCH | TRANSDERMAL | Status: DC
  Administered 2017-06-19: 1.5 mg via TRANSDERMAL
  Filled 2017-06-19: qty 1

## 2017-06-19 MED ORDER — ONDANSETRON HCL 4 MG/2ML IJ SOLN
INTRAMUSCULAR | Status: AC
  Filled 2017-06-19: qty 2

## 2017-06-19 SURGICAL SUPPLY — 56 items
APPLIER CLIP 5 13 M/L LIGAMAX5 (MISCELLANEOUS)
APPLIER CLIP ROT 13.4 12 LRG (CLIP)
BAG LAPAROSCOPIC 12 15 PORT 16 (BASKET) ×1 IMPLANT
BAG RETRIEVAL 12/15 (BASKET) ×2
BAG RETRIEVAL 12/15MM (BASKET) ×1
BANDAGE ADH SHEER 1  50/CT (GAUZE/BANDAGES/DRESSINGS) ×18 IMPLANT
BENZOIN TINCTURE PRP APPL 2/3 (GAUZE/BANDAGES/DRESSINGS) ×3 IMPLANT
BLADE SURG SZ11 CARB STEEL (BLADE) ×3 IMPLANT
CABLE HIGH FREQUENCY MONO STRZ (ELECTRODE) ×3 IMPLANT
CHLORAPREP W/TINT 26ML (MISCELLANEOUS) ×3 IMPLANT
CLIP APPLIE 5 13 M/L LIGAMAX5 (MISCELLANEOUS) IMPLANT
CLIP APPLIE ROT 13.4 12 LRG (CLIP) IMPLANT
CLOSURE WOUND 1/2 X4 (GAUZE/BANDAGES/DRESSINGS) ×1
COVER SURGICAL LIGHT HANDLE (MISCELLANEOUS) ×3 IMPLANT
DRAIN CHANNEL 19F RND (DRAIN) IMPLANT
DRAPE UNIVERSAL PACK (DRAPES) ×3 IMPLANT
ELECT REM PT RETURN 15FT ADLT (MISCELLANEOUS) ×3 IMPLANT
EVACUATOR SILICONE 100CC (DRAIN) IMPLANT
GAUZE SPONGE 4X4 12PLY STRL (GAUZE/BANDAGES/DRESSINGS) IMPLANT
GLOVE BIOGEL PI IND STRL 7.0 (GLOVE) ×1 IMPLANT
GLOVE BIOGEL PI INDICATOR 7.0 (GLOVE) ×2
GLOVE SURG SS PI 7.0 STRL IVOR (GLOVE) ×3 IMPLANT
GOWN STRL REUS W/TWL LRG LVL3 (GOWN DISPOSABLE) ×3 IMPLANT
GOWN STRL REUS W/TWL XL LVL3 (GOWN DISPOSABLE) ×9 IMPLANT
GRASPER SUT TROCAR 14GX15 (MISCELLANEOUS) ×3 IMPLANT
HANDLE STAPLE EGIA 4 XL (STAPLE) ×3 IMPLANT
HOVERMATT SINGLE USE (MISCELLANEOUS) ×3 IMPLANT
KIT BASIN OR (CUSTOM PROCEDURE TRAY) ×3 IMPLANT
MARKER SKIN DUAL TIP RULER LAB (MISCELLANEOUS) ×3 IMPLANT
NEEDLE SPNL 22GX3.5 QUINCKE BK (NEEDLE) ×3 IMPLANT
RELOAD EGIA 45 MED/THCK PURPLE (STAPLE) ×3 IMPLANT
RELOAD EGIA 60 MED/THCK PURPLE (STAPLE) ×6 IMPLANT
RELOAD EGIA BLACK ROTIC 45MM (STAPLE) IMPLANT
RELOAD TRI 2.0 60 XTHK VAS SUL (STAPLE) ×6 IMPLANT
SCISSORS LAP 5X45 EPIX DISP (ENDOMECHANICALS) IMPLANT
SET IRRIG TUBING LAPAROSCOPIC (IRRIGATION / IRRIGATOR) ×3 IMPLANT
SHEARS HARMONIC ACE PLUS 45CM (MISCELLANEOUS) ×3 IMPLANT
SLEEVE GASTRECTOMY 40FR VISIGI (MISCELLANEOUS) ×3 IMPLANT
SLEEVE XCEL OPT CAN 5 100 (ENDOMECHANICALS) ×6 IMPLANT
SOLUTION ANTI FOG 6CC (MISCELLANEOUS) ×3 IMPLANT
SPONGE LAP 18X18 X RAY DECT (DISPOSABLE) ×3 IMPLANT
STRIP CLOSURE SKIN 1/2X4 (GAUZE/BANDAGES/DRESSINGS) ×2 IMPLANT
SUT ETHIBOND 0 36 GRN (SUTURE) IMPLANT
SUT ETHILON 2 0 PS N (SUTURE) IMPLANT
SUT MNCRL AB 4-0 PS2 18 (SUTURE) ×3 IMPLANT
SUT VICRYL 0 TIES 12 18 (SUTURE) ×3 IMPLANT
SYR 20CC LL (SYRINGE) ×3 IMPLANT
SYR 50ML LL SCALE MARK (SYRINGE) ×3 IMPLANT
TOWEL OR 17X26 10 PK STRL BLUE (TOWEL DISPOSABLE) ×3 IMPLANT
TOWEL OR NON WOVEN STRL DISP B (DISPOSABLE) ×3 IMPLANT
TROCAR BLADELESS 15MM (ENDOMECHANICALS) ×3 IMPLANT
TROCAR BLADELESS OPT 5 100 (ENDOMECHANICALS) ×3 IMPLANT
TUBING CONNECTING 10 (TUBING) ×2 IMPLANT
TUBING CONNECTING 10' (TUBING) ×1
TUBING ENDO SMARTCAP PENTAX (MISCELLANEOUS) IMPLANT
TUBING INSUF HEATED (TUBING) ×3 IMPLANT

## 2017-06-19 NOTE — Progress Notes (Signed)
Md aware SBP 170's.  No orders received may tx to room

## 2017-06-19 NOTE — Anesthesia Procedure Notes (Signed)
Procedure Name: Intubation Date/Time: 06/19/2017 1:49 PM Performed by: Thornell MuleStubblefield, Jaziyah Gradel G, CRNA Pre-anesthesia Checklist: Patient identified, Emergency Drugs available, Suction available and Patient being monitored Patient Re-evaluated:Patient Re-evaluated prior to induction Oxygen Delivery Method: Circle system utilized Preoxygenation: Pre-oxygenation with 100% oxygen Induction Type: IV induction Ventilation: Mask ventilation without difficulty Laryngoscope Size: Miller and 3 Grade View: Grade I Tube type: Oral Tube size: 7.5 mm Number of attempts: 1 Airway Equipment and Method: Stylet and Oral airway Placement Confirmation: ETT inserted through vocal cords under direct vision,  positive ETCO2 and breath sounds checked- equal and bilateral Secured at: 21 cm Tube secured with: Tape Dental Injury: Teeth and Oropharynx as per pre-operative assessment

## 2017-06-19 NOTE — Transfer of Care (Signed)
Immediate Anesthesia Transfer of Care Note  Patient: Omar Wang  Procedure(s) Performed: LAPAROSCOPIC GASTRIC SLEEVE RESECTION, WITH HIATAL HERNIA REPAIR, AND UPPER ENDO (N/A Abdomen)  Patient Location: PACU  Anesthesia Type:General  Level of Consciousness: awake, alert  and oriented  Airway & Oxygen Therapy: Patient Spontanous Breathing and Patient connected to face mask oxygen  Post-op Assessment: Report given to RN and Post -op Vital signs reviewed and stable  Post vital signs: Reviewed and stable  Last Vitals:  Vitals:   06/19/17 1206  BP: (!) 156/90  Pulse: (!) 54  Resp: 18  Temp: (!) 36.4 C  SpO2: 98%    Last Pain:  Vitals:   06/19/17 1206  TempSrc: Oral         Complications: No apparent anesthesia complications

## 2017-06-19 NOTE — H&P (Signed)
Omar Wang is an 56 y.o. male.   Chief Complaint: obesity HPI: 56 yo male with long history of obesity and sleep apnea presents for bariatric surgery. He has completed all requirements and is ready to proceed.  Past Medical History:  Diagnosis Date  . Apnea   . Arthritis   . Asthma   . Hypertension   . Sleep apnea    c pap    Past Surgical History:  Procedure Laterality Date  . HERNIA REPAIR     abdominal  . reconstruction  left hand    . REPLACEMENT TOTAL HIP W/  RESURFACING IMPLANTS     bil    History reviewed. No pertinent family history. Social History:  reports that he has quit smoking. He quit after 10.00 years of use. he has never used smokeless tobacco. He reports that he does not drink alcohol or use drugs.  Allergies:  Allergies  Allergen Reactions  . Codeine Rash    Medications Prior to Admission  Medication Sig Dispense Refill  . budesonide (PULMICORT) 180 MCG/ACT inhaler Inhale 1 puff into the lungs daily.    . calcium carbonate (OSCAL) 1500 (600 Ca) MG TABS tablet Take 600 mg by mouth daily.    Marland Kitchen losartan-hydrochlorothiazide (HYZAAR) 50-12.5 MG tablet Take 1 tablet by mouth daily.    . Magnesium 250 MG TABS Take 250 mg by mouth daily.    . naproxen sodium (ALEVE) 220 MG tablet Take 440 mg by mouth daily as needed (for pain/headaches.).    Marland Kitchen Omega-3 Fatty Acids (FISH OIL) 500 MG CAPS Take 1,000 mg by mouth daily.    . vitamin B-12 (CYANOCOBALAMIN) 1000 MCG tablet Take 1,000 mcg by mouth daily.      Results for orders placed or performed during the hospital encounter of 06/19/17 (from the past 48 hour(s))  CBC WITH DIFFERENTIAL     Status: None   Collection Time: 06/19/17 12:14 PM  Result Value Ref Range   WBC 5.5 4.0 - 10.5 K/uL   RBC 4.88 4.22 - 5.81 MIL/uL   Hemoglobin 15.1 13.0 - 17.0 g/dL   HCT 45.1 39.0 - 52.0 %   MCV 92.4 78.0 - 100.0 fL   MCH 30.9 26.0 - 34.0 pg   MCHC 33.5 30.0 - 36.0 g/dL   RDW 13.4 11.5 - 15.5 %   Platelets 170 150 - 400  K/uL   Neutrophils Relative % 63 %   Neutro Abs 3.5 1.7 - 7.7 K/uL   Lymphocytes Relative 24 %   Lymphs Abs 1.3 0.7 - 4.0 K/uL   Monocytes Relative 10 %   Monocytes Absolute 0.5 0.1 - 1.0 K/uL   Eosinophils Relative 3 %   Eosinophils Absolute 0.2 0.0 - 0.7 K/uL   Basophils Relative 0 %   Basophils Absolute 0.0 0.0 - 0.1 K/uL  Comprehensive metabolic panel     Status: Abnormal   Collection Time: 06/19/17 12:14 PM  Result Value Ref Range   Sodium 136 135 - 145 mmol/L   Potassium 4.0 3.5 - 5.1 mmol/L   Chloride 103 101 - 111 mmol/L   CO2 24 22 - 32 mmol/L   Glucose, Bld 89 65 - 99 mg/dL   BUN 16 6 - 20 mg/dL   Creatinine, Ser 0.87 0.61 - 1.24 mg/dL   Calcium 9.4 8.9 - 10.3 mg/dL   Total Protein 7.5 6.5 - 8.1 g/dL   Albumin 4.4 3.5 - 5.0 g/dL   AST 45 (H) 15 - 41 U/L  ALT 69 (H) 17 - 63 U/L   Alkaline Phosphatase 82 38 - 126 U/L   Total Bilirubin 1.4 (H) 0.3 - 1.2 mg/dL   GFR calc non Af Amer >60 >60 mL/min   GFR calc Af Amer >60 >60 mL/min    Comment: (NOTE) The eGFR has been calculated using the CKD EPI equation. This calculation has not been validated in all clinical situations. eGFR's persistently <60 mL/min signify possible Chronic Kidney Disease.    Anion gap 9 5 - 15  Glucose, capillary     Status: None   Collection Time: 06/19/17 12:43 PM  Result Value Ref Range   Glucose-Capillary 82 65 - 99 mg/dL   No results found.  Review of Systems  Constitutional: Negative for chills and fever.  HENT: Negative for hearing loss.   Eyes: Negative for blurred vision and double vision.  Respiratory: Negative for cough and hemoptysis.   Cardiovascular: Negative for chest pain and palpitations.  Gastrointestinal: Negative for abdominal pain, nausea and vomiting.  Genitourinary: Negative for dysuria and urgency.  Musculoskeletal: Negative for myalgias and neck pain.  Skin: Negative for itching and rash.  Neurological: Negative for dizziness, tingling and headaches.   Endo/Heme/Allergies: Does not bruise/bleed easily.  Psychiatric/Behavioral: Negative for depression and suicidal ideas.    Blood pressure (!) 156/90, pulse (!) 54, temperature (!) 97.5 F (36.4 C), temperature source Oral, resp. rate 18, height 5' 11.5" (1.816 m), weight (!) 167.4 kg (369 lb), SpO2 98 %. Physical Exam  Vitals reviewed. Constitutional: He is oriented to person, place, and time. He appears well-developed and well-nourished.  HENT:  Head: Normocephalic and atraumatic.  Eyes: Conjunctivae and EOM are normal. Pupils are equal, round, and reactive to light.  Neck: Normal range of motion. Neck supple.  Cardiovascular: Normal rate and regular rhythm.  Respiratory: Effort normal and breath sounds normal.  GI: Soft. Bowel sounds are normal. He exhibits no distension. There is no tenderness.  Musculoskeletal: Normal range of motion.  Neurological: He is alert and oriented to person, place, and time.  Skin: Skin is warm and dry.  Psychiatric: He has a normal mood and affect. His behavior is normal.     Assessment/Plan 56 yo male with obesity, sleep apnea, hypertension -lap sleeve gastrectomy -ERAS protocol -bariatric post op protocol  Mickeal Skinner, MD 06/19/2017, 1:15 PM

## 2017-06-19 NOTE — Op Note (Signed)
Omar ManesRoger Wang [id number] 1960/07/26 06/19/2017  Preoperative diagnosis: morbid obesity  Postoperative diagnosis: Same   Procedure: upper endoscopy   Surgeon: Mary SellaEric M. Marrissa Dai M.D., FACS   Anesthesia: Gen.   Indications for procedure: 56 y.o. year old male undergoing Laparoscopic Gastric Sleeve Resection and an EGD was requested to evaluate the new gastric sleeve.   Description of procedure: After we have completed the sleeve resection, I scrubbed out and obtained the Olympus endoscope. I gently placed endoscope in the patient's oropharynx and gently glided it down the esophagus without any difficulty under direct visualization. Once I was in the gastric sleeve, I insufflated the stomach with air. I was able to cannulate and advanced the scope through the gastric sleeve. I was able to cannulate the duodenum with ease. Dr. Sheliah HatchKinsinger had placed saline in the upper abdomen. Upon further insufflation of the gastric sleeve there was no evidence of bubbles. GE junction located at 54 cm.  Upon further inspection of the gastric sleeve, the mucosa appeared normal. There is no evidence of any mucosal abnormality. The sleeve was widely patent at the angularis. There was no evidence of bleeding. The gastric sleeve was decompressed. The scope was withdrawn. The patient tolerated this portion of the procedure well. Please see Dr Guerry MinorsKinsinger's operative note for details regarding the laparoscopic gastric sleeve resection.   Mary SellaEric M. Andrey CampanileWilson, MD, FACS  General, Bariatric, & Minimally Invasive Surgery  [facility], [place]

## 2017-06-19 NOTE — Anesthesia Postprocedure Evaluation (Signed)
Anesthesia Post Note  Patient: Omar ManesRoger Wang  Procedure(s) Performed: LAPAROSCOPIC GASTRIC SLEEVE RESECTION, WITH HIATAL HERNIA REPAIR, AND UPPER ENDO (N/A Abdomen)     Patient location during evaluation: PACU Anesthesia Type: General Level of consciousness: awake and sedated Pain management: pain level controlled Vital Signs Assessment: post-procedure vital signs reviewed and stable Respiratory status: spontaneous breathing Cardiovascular status: stable Postop Assessment: no apparent nausea or vomiting    Last Vitals:  Vitals:   06/19/17 1630 06/19/17 1645  BP: (!) 176/86 (!) 158/77  Pulse: 66 71  Resp: (!) 33 19  Temp:    SpO2: 98% 95%    Last Pain:  Vitals:   06/19/17 1645  TempSrc:   PainSc: 5    Pain Goal:                 Omar Wang,Omar Wang

## 2017-06-19 NOTE — Op Note (Signed)
**Note Omar-Identified via Obfuscation** Preop Diagnosis: Obesity Class III  Postop Diagnosis: same  Procedure performed: laparoscopic Sleeve Gastrectomy  Assitant: Gaynelle AduEric Wilson  Indications:  The patient is a 56 y.o. year-old morbidly obese male who has been followed in the Bariatric Clinic as an outpatient. This patient was diagnosed with morbid obesity with a BMI of Body mass index is 50.75 kg/m. and significant co-morbidities including hypertension and sleep apnea.  The patient was counseled extensively in the Bariatric Outpatient Clinic and after a thorough explanation of the risks and benefits of surgery (including death from complications, bowel leak, infection such as peritonitis and/or sepsis, internal hernia, bleeding, need for blood transfusion, bowel obstruction, organ failure, pulmonary embolus, deep venous thrombosis, wound infection, incisional hernia, skin breakdown, and others entailed on the consent form) and after a compliant diet and exercise program, the patient was scheduled for an elective laparoscopic sleeve gastrectomy.  Description of Operation:  Following informed consent, the patient was taken to the operating room and placed on the operating table in the supine position.  He had previously received prophylactic antibiotics and subcutaneous heparin for DVT prophylaxis in the pre-op holding area.  After induction of general endotracheal anesthesia by the anesthesiologist, the patient underwent placement of sequential compression devices, Foley catheter and an oro-gastric tube.  A timeout was confirmed by the surgery and anesthesia teams.  The patient was adequately padded at all pressure points and placed on a footboard to prevent slippage from the OR table during extremes of position during surgery.  He underwent a routine sterile prep and drape of her entire abdomen.    Next, A transverse incision was made under the left subcostal area and a 5mm optical viewing trocar was introduced into the peritoneal cavity.  Pneumoperitoneum was applied with a high flow and low pressure. A laparoscope was inserted to confirm placement. A extraperitoneal block was then placed at the lateral abdominal wall using exparel diluted with marcaine. 5 additional incisions were placed: 1 5mm trocar to the left of the midline. 1 additional 5mm trocar in the left lateral area, 1 12mm trocar in the right mid abdomen, 1 5mm trocar in the right subcostal area, and a Nathanson retractor was placed through a subxiphoid incision.  The upper mid abdomen had multiple omental adhesions to the previous hernia repair. The superior aspect of these adhesions were taken down with sharp dissection to allo the instruments to pass to the stomach without causing additional trauma.  The hiatus appeared enlarged. Therefore, the pars flaccida was incised with harmonic scalpel. The stomach was reduced but on dissection of the posterior crus there was a visible hernia with small sac. The sac was dissected free and 1 0 ethibond sutures placed in interrupted fashion. A calibration tube was passed to ensure appropriate size of the hiatus.  The fat pad at the GE junction was incised and the gastrodiaphragmatic ligament was divided using the Harmonic scalpel. Next, a hole was created through the lesser omentum along the greater curve of the stomach to enter the lesser sac. The vessels along the greater omentum were  Then ligated and divided using the Harmonic scalpel moving towards the spleen and then short gastric vessels were ligated and divided in the same fashion to fully mobilize the fundus. The left crus was identified to ensure completion of the dissection. Next the antrum was measured and dissection continued inferiorly along the greater curve towards the pylorus and stopped 6cm from the pylorus.   A 40Fr ViSiGi dilator was placed into the esophgaus  and along the lesser curve of the stomach and placed on suction. 2 non-reinforced 60mm 4-615mm tristapler(s)  followed by 4 60mm 3-164mm tristaplers were used to make the resection along the antrum being sure to stay well away from the angularis by angling the jaws of the stapler towards the greater curve and later completing the resection staying along the ViSiGi and ensuring the fundus was not retained by appropriately retracting it lateral. Air was inserted through the ViSiGi to perform a leak test showing no bubbles and a neutral lie of the stomach.  The assistant then went and performed an upper endoscopy and leak test. No bubbles were seen and the sleeve and antrum distended appropriately. The specimen was then placed in an endocatch bag and removed by the 15mm port. The fascia of the 15mm port was closed with a 0 vicryl by suture passer. Hemostasis was ensured. Pneumoperitoneum was evacuated, all ports were removed and all incisions closed with 4-0 monocryl suture in subcuticular fashion. Steristrips and bandaids were put in place for dressing. The patient awoke from anesthesia and was brought to pacu in stable condition. All counts were correct.  Estimated blood loss: 30ml  Specimens:  Sleeve gastrectomy  Local Anesthesia: 50 ml Exparel:0.5% Marcaine mix  Post-Op Plan:       Pain Management: PO, prn      Antibiotics: Prophylactic      Anticoagulation: Prophylactic, Starting now      Post Op Studies/Consults: Not applicable      Intended Discharge: within 48h      Intended Outpatient Follow-Up: Two Week      Intended Outpatient Studies: Not Applicable      Other: Not Applicable   Omar Wang

## 2017-06-20 ENCOUNTER — Other Ambulatory Visit: Payer: Self-pay

## 2017-06-20 ENCOUNTER — Encounter (HOSPITAL_COMMUNITY): Payer: Self-pay | Admitting: General Surgery

## 2017-06-20 LAB — COMPREHENSIVE METABOLIC PANEL WITH GFR
ALT: 66 U/L — ABNORMAL HIGH (ref 17–63)
AST: 40 U/L (ref 15–41)
Albumin: 3.5 g/dL (ref 3.5–5.0)
Alkaline Phosphatase: 69 U/L (ref 38–126)
Anion gap: 9 (ref 5–15)
BUN: 13 mg/dL (ref 6–20)
CO2: 23 mmol/L (ref 22–32)
Calcium: 8.6 mg/dL — ABNORMAL LOW (ref 8.9–10.3)
Chloride: 104 mmol/L (ref 101–111)
Creatinine, Ser: 0.89 mg/dL (ref 0.61–1.24)
GFR calc Af Amer: >60 mL/min
GFR calc non Af Amer: >60 mL/min
Glucose, Bld: 139 mg/dL — ABNORMAL HIGH (ref 65–99)
Potassium: 4.3 mmol/L (ref 3.5–5.1)
Sodium: 136 mmol/L (ref 135–145)
Total Bilirubin: 1.1 mg/dL (ref 0.3–1.2)
Total Protein: 6.3 g/dL — ABNORMAL LOW (ref 6.5–8.1)

## 2017-06-20 LAB — CBC WITH DIFFERENTIAL/PLATELET
BASOS PCT: 0 %
Basophils Absolute: 0 10*3/uL (ref 0.0–0.1)
EOS ABS: 0 10*3/uL (ref 0.0–0.7)
EOS PCT: 0 %
HCT: 40.6 % (ref 39.0–52.0)
Hemoglobin: 13.2 g/dL (ref 13.0–17.0)
LYMPHS ABS: 0.6 10*3/uL — AB (ref 0.7–4.0)
Lymphocytes Relative: 8 %
MCH: 30.3 pg (ref 26.0–34.0)
MCHC: 32.5 g/dL (ref 30.0–36.0)
MCV: 93.3 fL (ref 78.0–100.0)
Monocytes Absolute: 0.8 10*3/uL (ref 0.1–1.0)
Monocytes Relative: 10 %
Neutro Abs: 6.4 10*3/uL (ref 1.7–7.7)
Neutrophils Relative %: 82 %
PLATELETS: 175 10*3/uL (ref 150–400)
RBC: 4.35 MIL/uL (ref 4.22–5.81)
RDW: 13.5 % (ref 11.5–15.5)
WBC: 7.7 10*3/uL (ref 4.0–10.5)

## 2017-06-20 MED ORDER — LOSARTAN POTASSIUM 50 MG PO TABS
50.0000 mg | ORAL_TABLET | Freq: Every day | ORAL | Status: DC
  Administered 2017-06-20: 50 mg via ORAL
  Filled 2017-06-20: qty 1

## 2017-06-20 MED ORDER — ONDANSETRON 4 MG PO TBDP: 4.0000 mg | ORAL_TABLET | Freq: Three times a day (TID) | ORAL | 0 refills | Status: AC | PRN

## 2017-06-20 MED ORDER — PANTOPRAZOLE SODIUM 40 MG PO TBEC: 40.0000 mg | DELAYED_RELEASE_TABLET | Freq: Every day | ORAL | 0 refills | Status: AC

## 2017-06-20 MED ORDER — TRAMADOL HCL 50 MG PO TABS: 50.0000 mg | ORAL_TABLET | Freq: Four times a day (QID) | ORAL | 0 refills | Status: AC | PRN

## 2017-06-20 NOTE — Anesthesia Preprocedure Evaluation (Signed)
Anesthesia Evaluation  Patient identified by MRN, date of birth, ID band Patient awake    Reviewed: Allergy & Precautions, NPO status , Patient's Chart, lab work & pertinent test results  Airway Mallampati: II  TM Distance: >3 FB Neck ROM: Full    Dental   Pulmonary sleep apnea , former smoker,    Pulmonary exam normal        Cardiovascular hypertension, Pt. on medications Normal cardiovascular exam     Neuro/Psych    GI/Hepatic   Endo/Other  Morbid obesity  Renal/GU      Musculoskeletal   Abdominal   Peds  Hematology   Anesthesia Other Findings   Reproductive/Obstetrics                             Anesthesia Physical Anesthesia Plan  ASA: III  Anesthesia Plan: General   Post-op Pain Management:    Induction: Intravenous  PONV Risk Score and Plan: 2 and Ondansetron, Dexamethasone, Treatment may vary due to age or medical condition and Scopolamine patch - Pre-op  Airway Management Planned: Oral ETT  Additional Equipment:   Intra-op Plan:   Post-operative Plan: Extubation in OR  Informed Consent: I have reviewed the patients History and Physical, chart, labs and discussed the procedure including the risks, benefits and alternatives for the proposed anesthesia with the patient or authorized representative who has indicated his/her understanding and acceptance.     Plan Discussed with: CRNA and Surgeon  Anesthesia Plan Comments:         Anesthesia Quick Evaluation

## 2017-06-20 NOTE — Discharge Instructions (Signed)
° ° ° °GASTRIC BYPASS/SLEEVE ° Home Care Instructions ° ° These instructions are to help you care for yourself when you go home. ° °Call: If you have any problems. °• Call [phone] and ask for the surgeon on call °• If you need immediate help, come to the ER at [facility].  °• Tell the ER staff that you are a new post-op gastric bypass or gastric sleeve patient °  °Signs and symptoms to report: • Severe vomiting or nausea °o If you cannot keep down clear liquids for longer than 1 day, call your surgeon  °• Abdominal pain that does not get better after taking your pain medication °• Fever over 100.4° F with chills °• Heart beating over 100 beats a minute °• Shortness of breath at rest °• Chest pain °•  Redness, swelling, drainage, or foul odor at incision (surgical) sites °•  If your incisions open or pull apart °• Swelling or pain in calf (lower leg) °• Diarrhea (Loose bowel movements that happen often), frequent watery, uncontrolled bowel movements °• Constipation, (no bowel movements for 3 days) if this happens: Pick one °o Milk of Magnesia, 2 tablespoons by mouth, 3 times a day for 2 days if needed °o Stop taking Milk of Magnesia once you have a bowel movement °o Call your doctor if constipation continues °Or °o Miralax  (instead of Milk of Magnesia) following the label instructions °o Stop taking Miralax once you have a bowel movement °o Call your doctor if constipation continues °• Anything you think is not normal °  °Normal side effects after surgery: • Unable to sleep at night or unable to focus °• Irritability or moody °• Being tearful (crying) or depressed °These are common complaints, possibly related to your anesthesia medications that put you to sleep, stress of surgery, and change in lifestyle.  This usually goes away a few weeks after surgery.  If these feelings continue, call your primary care doctor. °  °Wound Care: You may have surgical glue, steri-strips, or staples over your incisions after  surgery °• Surgical glue:  Looks like a clear film over your incisions and will wear off a little at a time °• Steri-strips: Strips of tape over your incisions. You may notice a yellowish color on the skin under the steri-strips. This is used to make the   steri-strips stick better. Do not pull the steri-strips off - let them fall off °• Staples: Staples may be removed before you leave the hospital °o If you go home with staples, call [facility], [phone] at for an appointment with your surgeon’s nurse to have staples removed 10 days after surgery. °• Showering: You may shower two (2) days after your surgery unless your surgeon tells you differently °o Wash gently around incisions with warm soapy water, rinse well, and gently pat dry  °o No tub baths until staples are removed, steri-strips fall off or glue is gone.  °  °Medications: • Medications should be liquid or crushed if larger than the size of a dime °• Extended release pills (medication that release a little bit at a time through the day) should NOT be crushed or cut. (examples include XL, ER, DR, SR) °• Depending on the size and number of medications you take, you may need to space (take a few throughout the day)/change the time you take your medications so that you do not over-fill your pouch (smaller stomach) °• Make sure you follow-up with your primary care doctor to   make medication changes needed during rapid weight loss and life-style changes °• If you have diabetes, follow up with the doctor that orders your diabetes medication(s) within one week after surgery and check your blood sugar regularly. °• Do not drive while taking prescription pain medication  °• It is ok to take Tylenol by the bottle instructions with your pain medicine or instead of your pain medicine as needed.  DO NOT TAKE NSAIDS (EXAMPLES OF NSAIDS:  IBUPROFREN/ NAPROXEN)  °Diet:                    First 2 Weeks ° You will see the dietician t about two (2) weeks  after your surgery. The dietician will increase the types of foods you can eat if you are handling liquids well: °• If you have severe vomiting or nausea and cannot keep down clear liquids lasting longer than 1 day, call your surgeon @ ([phone]) °Protein Shake °• Drink at least 2 ounces of shake 5-6 times per day °• Each serving of protein shakes (usually 8 - 12 ounces) should have: °o 15 grams of protein  °o And no more than 5 grams of carbohydrate  °• Goal for protein each day: °o Men = 80 grams per day °o Women = 60 grams per day °• Protein powder may be added to fluids such as non-fat milk or Lactaid milk or unsweetened Soy/Almond milk (limit to 35 grams added protein powder per serving) ° °Hydration °• Slowly increase the amount of water and other clear liquids as tolerated (See Acceptable Fluids) °• Slowly increase the amount of protein shake as tolerated  °•  Sip fluids slowly and throughout the day.  Do not use straws. °• May use sugar substitutes in small amounts (no more than 6 - 8 packets per day; i.e. Splenda) ° °Fluid Goal °• The first goal is to drink at least 8 ounces of protein shake/drink per day (or as directed by the nutritionist); some examples of protein shakes are Syntrax Nectar, Adkins Advantage, EAS Edge HP, and Unjury. See handout from pre-op Bariatric Education Class: °o Slowly increase the amount of protein shake you drink as tolerated °o You may find it easier to slowly sip shakes throughout the day °o It is important to get your proteins in first °• Your fluid goal is to drink 64 - 100 ounces of fluid daily °o It may take a few weeks to build up to this °• 32 oz (or more) should be clear liquids  °And  °• 32 oz (or more) should be full liquids (see below for examples) °• Liquids should not contain sugar, caffeine, or carbonation ° °Clear Liquids: °• Water or Sugar-free flavored water (i.e. Fruit H2O, Propel) °• Decaffeinated coffee or tea (sugar-free) °• Crystal Lite, Wyler’s Lite,  Minute Maid Lite °• Sugar-free Jell-O °• Bouillon or broth °• Sugar-free Popsicle:   *Less than 20 calories each; Limit 1 per day ° °Full Liquids: °Protein Shakes/Drinks + 2 choices per day of other full liquids °• Full liquids must be: °o No More Than 15 grams of Carbs per serving  °o No More Than 3 grams of Fat per serving °• Strained low-fat cream soup (except Cream of Potato or Tomato) °• Non-Fat milk °• Fat-free Lactaid Milk °• Unsweetened Soy Or Unsweetened Almond Milk °• Low Sugar yogurt (Dannon Lite & Fit, Greek yogurt; Oikos Triple Zero; Chobani Simply 100; Yoplait 100 calorie Greek - No Fruit on the Bottom) ° °  °Vitamins   and Minerals • Start 1 day after surgery unless otherwise directed by your surgeon °• 2 Chewable Bariatric Specific Multivitamin / Multimineral Supplement with iron (Example: Bariatric Advantage Multi EA) °• Chewable Calcium with Vitamin D-3 °(Example: 3 Chewable Calcium Plus 600 with Vitamin D-3) °o Take 500 mg three (3) times a day for a total of 1500 mg each day °o Do not take all 3 doses of calcium at one time as it may cause constipation, and you can only absorb 500 mg  at a time  °o Do not mix multivitamins containing iron with calcium supplements; take 2 hours apart °• Menstruating women and those with a history of anemia (a blood disease that causes weakness) may need extra iron °o Talk with your doctor to see if you need more iron °• Do not stop taking or change any vitamins or minerals until you talk to your dietitian or surgeon °• Your Dietitian and/or surgeon must approve all vitamin and mineral supplements °  °Activity and Exercise: Limit your physical activity as instructed by your doctor.  It is important to continue walking at home.  During this time, use these guidelines: °• Do not lift anything greater than ten (10) pounds for at least two (2) weeks °• Do not go back to work or drive until your surgeon says you can °• You may have sex when you feel comfortable  °o It is  VERY important for male patients to use a reliable birth control method; fertility often increases after surgery  °o All hormonal birth control will be ineffective for 30 days after surgery due to medications given during surgery a barrier method must be used. °o Do not get pregnant for at least 18 months °• Start exercising as soon as your doctor tells you that you can °o Make sure your doctor approves any physical activity °• Start with a simple walking program °• Walk 5-15 minutes each day, 7 days per week.  °• Slowly increase until you are walking 30-45 minutes per day °Consider joining our BELT program. [phone] or email [email or web address] °  °Special Instructions Things to remember: °• Use your CPAP when sleeping if this applies to you ° °• [facility] has two free Bariatric Surgery Support Groups that meet monthly °o The 3rd Thursday of each month, 6 pm, [facility]  °o The 2nd Friday of each month, 11:45 am in the private dining room in the basement of  °• It is very important to keep all follow up appointments with your surgeon, dietitian, primary care physician, and behavioral health practitioner °• Routine follow up schedule with your surgeon include appointments at 2-3 weeks, 6-8 weeks, 6 months, and 1 year at a minimum.  Your surgeon may request to see you more often.   °o After the first year, please follow up with your bariatric surgeon and dietitian at least once a year in order to maintain best weight loss results °[facility]: [phone] °[facility]: [phone] °Bariatric Nurse Coordinator: [phone] °  °   Reviewed and Endorsed  °by [facility] Patient Education Committee, June, 2016 °Edits Approved: Aug, 2018 ° ° ° °

## 2017-06-20 NOTE — Progress Notes (Signed)
Patient alert and oriented, Post op day 1.  Provided support and encouragement.  Encouraged pulmonary toilet, ambulation and small sips of liquids.  All questions answered.  Will continue to monitor. 

## 2017-06-20 NOTE — Progress Notes (Signed)
Pt was discharged home today. Instructions were reviewed with patient, and questions were answered. Pt was taken to main entrance via wheelchair.   

## 2017-06-20 NOTE — Progress Notes (Signed)
Patient alert and oriented, pain is controlled. Patient is tolerating fluids, advanced to protein shake today, patient is tolerating well.  Reviewed Gastric sleeve discharge instructions with patient and patient is able to articulate understanding.  Provided information on BELT program, Support Group and [facility] outpatient pharmacy. All questions answered, will continue to monitor.  

## 2017-06-20 NOTE — Discharge Summary (Signed)
Physician Discharge Summary  Omar ManesRoger Dungan ZOX:[id number] DOB: October 26, 1960 DOA: 06/19/2017  PCP: Leanora Ivanoffellinger, Robert C. Jr., MD  Admit date: 06/19/2017 Discharge date: 06/20/2017  Recommendations for Outpatient Follow-up:  1.  (include homehealth, outpatient follow-up instructions, specific recommendations for PCP to follow-up on, etc.)  Follow-up Information    Stevey Stapleton, De BlanchLuke Aaron, MD. Go on 07/06/2017.   Specialty:  General Surgery Why:  at [address] Contact information: [address] [place] [address] [phone]          Discharge Diagnoses:  Active Problems:   Morbid obesity [facility](HCC)   Surgical Procedure: Laparoscopic Sleeve Gastrectomy, upper endoscopy  Discharge Condition: Good Disposition: Home  Diet recommendation: Postoperative sleeve gastrectomy diet (liquids only)  Filed Weights   06/19/17 1206 06/20/17 0710  Weight: (!) 167.4 kg (369 lb) (!) 169.9 kg (374 lb 9 oz)     Hospital Course:  The patient was admitted after undergoing laparoscopic sleeve gastrectomy. POD 0 he ambulated well. POD 1 he was started on the water diet protocol and tolerated 120 ml in the first shift. Once meeting the water amount he was advanced to bariatric protein shakes which they tolerated and were discharged home POD 1.  Treatments: surgery: laparoscopic sleeve gastrectomy  Discharge Instructions  Discharge Instructions    Ambulate hourly while awake   Complete by:  As directed    Call MD for:  difficulty breathing, headache or visual disturbances   Complete by:  As directed    Call MD for:  persistant dizziness or light-headedness   Complete by:  As directed    Call MD for:  persistant nausea and vomiting   Complete by:  As directed    Call MD for:  redness, tenderness, or signs of infection (pain, swelling, redness, odor or green/yellow discharge around incision site)   Complete by:  As directed    Call MD for:  severe uncontrolled pain   Complete by:  As  directed    Call MD for:  temperature >101 F   Complete by:  As directed    Diet bariatric full liquid   Complete by:  As directed    Discharge wound care:   Complete by:  As directed    Remove Bandaids tomorrow, ok to shower tomorrow. Steristrips may fall off in 1-3 weeks.   Incentive spirometry   Complete by:  As directed    Perform hourly while awake     Allergies as of 06/20/2017      Reactions   Codeine Rash      Medication List    STOP taking these medications   naproxen sodium 220 MG tablet Commonly known as:  ALEVE     TAKE these medications   budesonide 180 MCG/ACT inhaler Commonly known as:  PULMICORT Inhale 1 puff into the lungs daily.   calcium carbonate 1500 (600 Ca) MG Tabs tablet Commonly known as:  OSCAL Take 600 mg by mouth daily.   Fish Oil 500 MG Caps Take 1,000 mg by mouth daily.   losartan-hydrochlorothiazide 50-12.5 MG tablet Commonly known as:  HYZAAR Take 1 tablet by mouth daily. Notes to patient:  Monitor Blood Pressure Daily and keep a log for primary care physician.  You may need to make changes to your medications with rapid weight loss.     Magnesium 250 MG Tabs Take 250 mg by mouth daily.   ondansetron 4 MG disintegrating tablet Commonly known as:  ZOFRAN ODT Take 1 tablet (4 mg  total) by mouth every 8 (eight) hours as needed for nausea or vomiting.   pantoprazole 40 MG tablet Commonly known as:  PROTONIX Take 1 tablet (40 mg total) by mouth daily for 60 doses.   traMADol 50 MG tablet Commonly known as:  ULTRAM Take 1 tablet (50 mg total) by mouth every 6 (six) hours as needed for moderate pain (mild pain).   vitamin B-12 1000 MCG tablet Commonly known as:  CYANOCOBALAMIN Take 1,000 mcg by mouth daily.            Discharge Care Instructions  (From admission, onward)        Start     Ordered   06/20/17 0000  Discharge wound care:    Comments:  Remove Bandaids tomorrow, ok to shower tomorrow. Steristrips may fall  off in 1-3 weeks.   06/20/17 [id number]     Follow-up Information    Channon Ambrosini, De BlanchLuke Aaron, MD. Go on 07/06/2017.   Specialty:  General Surgery Why:  at [address] Contact information: [address] [place] [phone]            The results of significant diagnostics from this hospitalization (including imaging, microbiology, ancillary and laboratory) are listed below for reference.    Significant Diagnostic Studies: No results found.  Labs: Basic Metabolic Panel: Recent Labs  Lab 06/19/17 1214 06/20/17 0503  NA 136 136  K 4.0 4.3  CL 103 104  CO2 24 23  GLUCOSE 89 139*  BUN 16 13  CREATININE 0.87 0.89  CALCIUM 9.4 8.6*   Liver Function Tests: Recent Labs  Lab 06/19/17 1214 06/20/17 0503  AST 45* 40  ALT 69* 66*  ALKPHOS 82 69  BILITOT 1.4* 1.1  PROT 7.5 6.3*  ALBUMIN 4.4 3.5    CBC: Recent Labs  Lab 06/19/17 1214 06/19/17 1734 06/20/17 0503  WBC 5.5  --  7.7  NEUTROABS 3.5  --  6.4  HGB 15.1 14.3 13.2  HCT 45.1 43.5 40.6  MCV 92.4  --  93.3  PLT 170  --  175    CBG: Recent Labs  Lab 06/19/17 1243  GLUCAP 82    Active Problems:   Morbid obesity (HCC)   Time coordinating discharge: 15min

## 2017-06-27 ENCOUNTER — Telehealth (HOSPITAL_COMMUNITY): Payer: Self-pay

## 2017-06-27 NOTE — Telephone Encounter (Signed)
Follow up with bariatric surgical patient to discuss post discharge questions.  No answer at this time voicemail left for patient along with contact information to discuss the following questions.  1.  Are you having any pain not relieved by pain medication?hasn't taken in several days  2.  How much fluid total fluid intake have you had in the last 24/48 hours?  54 +ounces of fluid  3.  How much protein intake have you had in the last 24/48 hours?60 + grams of protein  4.  Have you had any trouble making urine?yes  5.  Have you had nausea that has not been relieved by nausea medication?has not needed nausea medication  6.  Are you ambulating every hour?yes  7.  Are you passing gas or had a BM?had BM every other day  8.  Do you know how to contact [facility]? [facility]? [facility]?yes  9.  Are you taking your vitamins and calcium without difficulty?yes  10. Tell me how your incision looks?  Any redness, open incision, or drainage?look good

## 2017-07-04 ENCOUNTER — Encounter: Payer: Managed Care, Other (non HMO) | Attending: General Surgery | Admitting: Skilled Nursing Facility1

## 2017-07-04 ENCOUNTER — Encounter: Payer: Self-pay | Admitting: Skilled Nursing Facility1

## 2017-07-04 DIAGNOSIS — Z713 Dietary counseling and surveillance: Secondary | ICD-10-CM | POA: Insufficient documentation

## 2017-07-04 NOTE — Progress Notes (Signed)
Bariatric Class:  Appt start time: 1530 end time:  1630.  2 Week Post-Operative Nutrition Class  Patient was seen on 07/04/2016 for Post-Operative Nutrition education at [facility].   Surgery date: 06/19/2017 Surgery type: sleeve Start weight at [facility]: 390 Weight today: 356.7  TANITA  BODY COMP RESULTS  Pt declined   BMI (kg/m^2)    Fat Mass (lbs)    Fat Free Mass (lbs)    Total Body Water (lbs)    The following the learning objectives were met by the patient during this course:  Identifies Phase 3A (Soft, High Proteins) Dietary Goals and will begin from 2 weeks post-operatively to 2 months post-operatively  Identifies appropriate sources of fluids and proteins   States protein recommendations and appropriate sources post-operatively  Identifies the need for appropriate texture modifications, mastication, and bite sizes when consuming solids  Identifies appropriate multivitamin and calcium sources post-operatively  Describes the need for physical activity post-operatively and will follow MD recommendations  States when to call healthcare provider regarding medication questions or post-operative complications  Handouts given during class include:  Phase 3A: Soft, High Protein Diet Handout  Follow-Up Plan: Patient will follow-up at [facility] in 6 weeks for 2 month post-op nutrition visit for diet advancement per MD.    

## 2017-08-15 ENCOUNTER — Encounter: Payer: Managed Care, Other (non HMO) | Attending: General Surgery | Admitting: Skilled Nursing Facility1

## 2017-08-15 ENCOUNTER — Encounter: Payer: Self-pay | Admitting: Skilled Nursing Facility1

## 2017-08-15 DIAGNOSIS — Z713 Dietary counseling and surveillance: Secondary | ICD-10-CM | POA: Diagnosis not present

## 2017-08-15 NOTE — Progress Notes (Signed)
Follow-up visit:  8 Weeks Post-Operative Sleeve Surgery  Primary concerns today: Post-operative Bariatric Surgery Nutrition Management.  Pt states his blood pressure medication has been lowered. Pt does not like vegetables. Pt states he will try carrots and might eat a salad.   Surgery date: 06/19/2017 Surgery type: sleeve [facility] Weight today: 339.5 Weight change: 17.2  24-hr recall: B (AM): boiled egg and cheese stick Snk (AM): 1 boiled egg and cheese stick L (PM): chicken breast or canned chicken or hamburger patty or tuna Snk (PM): lunchable: meat and cheese with the crackers  D (PM): chicken breast or canned chicken or hamburger patty or tuna Snk (PM): 1 sugar free popcicle   Fluid intake: 16-20oz decaf coffee, water: 78 oz Estimated total protein intake: 60+  Medications: see list Supplementation: bariatric advantage and tums   Using straws: no Drinking while eating: no Having you been chewing well:no Chewing/swallowing difficulties: no Changes in vision: no Changes to mood/headaches: no Hair loss/Cahnges to skin/Changes to nails: no Any difficulty focusing or concentrating: no Sweating: no Dizziness/Lightheaded: no Palpitations: no  Carbonated beverages: no N/V/D/C/GAS: no Abdominal Pain: no Dumping syndrome: no  Recent physical activity:    Progress Towards Goal(s):  In progress.  Handouts given during visit include:  Non-starchy vegetables   Nutritional Diagnosis:  [place]-3.3 Overweight/obesity related to past poor dietary habits and physical inactivity as evidenced by patient w/ recent sleeve surgery following dietary guidelines for continued weight loss.     Intervention:  Nutrition counseling. Dietitian educated the pt on advancing his diet to include non-starchy vegetables. Goals: -Eat your protein first then start in on your vegetables -start with soft cooked first   Teaching Method Utilized:  Visual Auditory Hands  on  Barriers to learning/adherence to lifestyle change: previous dislike of vegetables   Demonstrated degree of understanding via:  Teach Back   Monitoring/Evaluation:  Dietary intake, exercise, and body weight.

## 2017-08-15 NOTE — Patient Instructions (Signed)
-  Eat your protein first then start in on your vegetables  -start with soft cooked first

## 2017-10-11 ENCOUNTER — Encounter: Payer: Managed Care, Other (non HMO) | Attending: General Surgery | Admitting: Skilled Nursing Facility1

## 2017-10-11 ENCOUNTER — Encounter: Payer: Self-pay | Admitting: Skilled Nursing Facility1

## 2017-10-11 DIAGNOSIS — Z713 Dietary counseling and surveillance: Secondary | ICD-10-CM | POA: Insufficient documentation

## 2017-10-11 NOTE — Progress Notes (Signed)
Post-Operative Sleeve Surgery  Primary concerns today: Post-operative Bariatric Surgery Nutrition Management. Pt sates his blood pressure pill has been cut in half. Pt states he is doing pretty good with the vegetables. Pt answered his phone during the appt. Pt states he does not like fruit either.   Surgery date: 06/19/2017 Surgery type: sleeve [facility] Weight today: 322 Weight change: 17.5  24-hr recall: B (AM): boiled egg and cheese stick---cereal (cheerios)-4oz with fairlife milk Snk (AM): almonds and granola bar L (PM): chicken breast or canned chicken or hamburger patty or tuna or lunchable turkey-ham Snk (PM): lunchable: meat and cheese with the crackers or almonds and granola  D (PM): chicken breast or canned chicken or hamburger patty or tuna Snk (PM): 1 sugar free popcicle   Fluid intake: 16-20oz decaf coffee, water: 78 oz Estimated total protein intake: 60+  Medications: see list Supplementation: bariatric advantage capsule and tums-not remembered often   Using straws: no Drinking while eating: no Having you been chewing well:no Chewing/swallowing difficulties: no Changes in vision: no Changes to mood/headaches: no Hair loss/Cahnges to skin/Changes to nails: no Any difficulty focusing or concentrating: no Sweating: no Dizziness/Lightheaded: no Palpitations: no  Carbonated beverages: no N/V/D/C/GAS: no Abdominal Pain: no Dumping syndrome: no  Recent physical activity:  Walking more at work: [id number] steps choosing to walk instead of using the cart  Progress Towards Goal(s):  In progress.  Handouts given during visit include:  Non-starchy vegetables   Nutritional Diagnosis:  [place]-3.3 Overweight/obesity related to past poor dietary habits and physical inactivity as evidenced by patient w/ recent sleeve surgery following dietary guidelines for continued weight loss.  Intervention:  Nutrition counseling. Dietitian educated the pt on advancing his  diet to include non-starchy vegetables. Goals: -Eat your protein first then start in on your vegetables -start with soft cooked first   Teaching Method Utilized:  Visual Auditory Hands on  Barriers to learning/adherence to lifestyle change: previous dislike of vegetables   Demonstrated degree of understanding via:  Teach Back   Monitoring/Evaluation:  Dietary intake, exercise, and body weight.

## 2018-01-02 ENCOUNTER — Encounter: Payer: Managed Care, Other (non HMO) | Attending: General Surgery | Admitting: Skilled Nursing Facility1

## 2018-01-02 ENCOUNTER — Encounter: Payer: Self-pay | Admitting: Skilled Nursing Facility1

## 2018-01-02 DIAGNOSIS — Z713 Dietary counseling and surveillance: Secondary | ICD-10-CM | POA: Diagnosis not present

## 2018-01-02 NOTE — Progress Notes (Signed)
Post-Operative Sleeve Surgery  Primary concerns today: Post-operative Bariatric Surgery Nutrition Management. Pt states he is no longer taking his blood pressure medication. Pt states he will try kefir. Pt states he swims 3 times a week for about an hour.  Pt is still working on eating vegetables but has done a fantastic job of eating some.   Surgery date: 06/19/2017 Surgery type: sleeve [facility] Weight today: 303 Weight change: 17  24-hr recall: B (AM): boiled egg and cheese stick---cereal (cheerios)-4oz with fairlife milk Snk (AM): almonds and granola bar L (PM): chicken breast or canned chicken or hamburger patty or tuna or lunchable turkey-ham or 2 boiled eggs (somtiems picking out of his wives salad) Snk (PM): lunchable: meat and cheese with the crackers or almonds and granola  D (PM): chicken breast or canned chicken or hamburger patty or tuna Snk (PM): 1 sugar free popcicle   Fluid intake: 16-20oz decaf coffee, water: 78 oz Estimated total protein intake: 60+  Medications: see list Supplementation: bariatric advantage capsule and tums-not remembered often   Using straws: no Drinking while eating: no Having you been chewing well:no Chewing/swallowing difficulties: no Changes in vision: no Changes to mood/headaches: no Hair loss/Cahnges to skin/Changes to nails: no Any difficulty focusing or concentrating: no Sweating: no Dizziness/Lightheaded: no Palpitations: no  Carbonated beverages: no N/V/D/C/GAS: no Abdominal Pain: no Dumping syndrome: no  Recent physical activity:  Walking more at work: [id number] steps choosing to walk instead of using the cart  Progress Towards Goal(s):  In progress.  Handouts given during visit include:  Non-starchy vegetables   Nutritional Diagnosis:  [place]-3.3 Overweight/obesity related to past poor dietary habits and physical inactivity as evidenced by patient w/ recent sleeve surgery following dietary guidelines for  continued weight loss.  Intervention:  Nutrition counseling. Dietitian educated the pt on advancing his diet to include non-starchy vegetables. Goals: -Keep up the great work!  Teaching Method Utilized:  Visual Auditory Hands on  Barriers to learning/adherence to lifestyle change: previous dislike of vegetables   Demonstrated degree of understanding via:  Teach Back   Monitoring/Evaluation:  Dietary intake, exercise, and body weight.

## 2018-02-27 IMAGING — RF DG UGI W/ KUB
7 series · 14 of 16 positions shown · non-contrast
Comparison: None.

CLINICAL DATA: Preoperative evaluation prior to sleeve gastrectomy.

EXAM:
UPPER GI SERIES WITH KUB
TECHNIQUE: After obtaining a scout radiograph a routine upper GI series was
performed using thin and high density barium.
FLUOROSCOPY TIME:  Fluoroscopy Time:  0.8 minutes.
Radiation Exposure Index (if provided by the fluoroscopic device):
56.1 mGy.
Number of Acquired Spot Images: 3

[Series 1: t abdomen supine · 0.15mm/px · 1 of 1 slices shown]
[im 1/1]
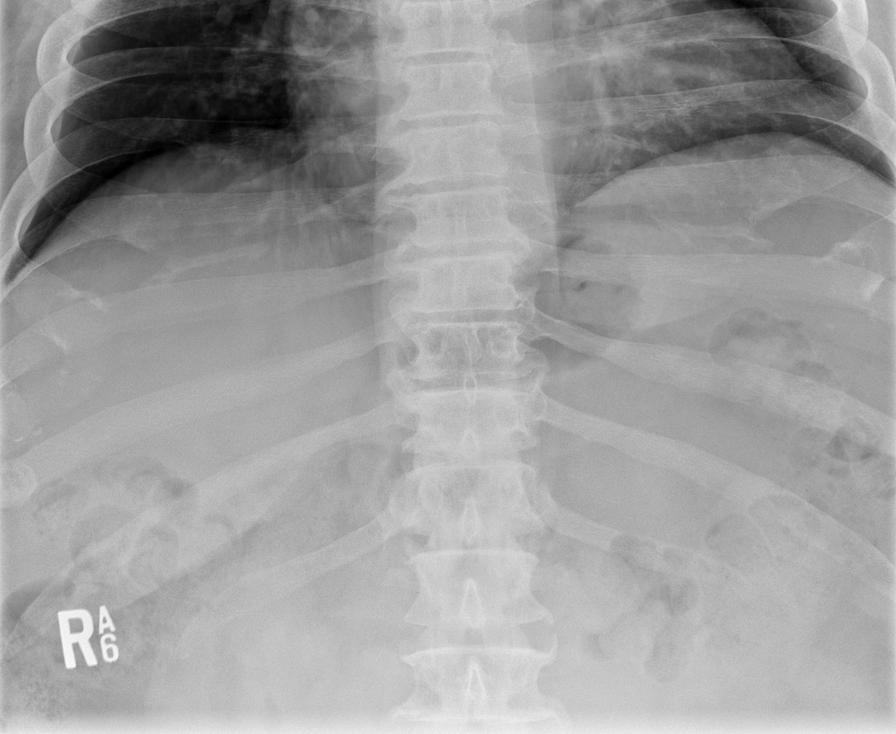

[Series 2: cp_standard · 0.52mm/px · 3 of 115 frames shown (1 of 3)]
[frame 1/115]
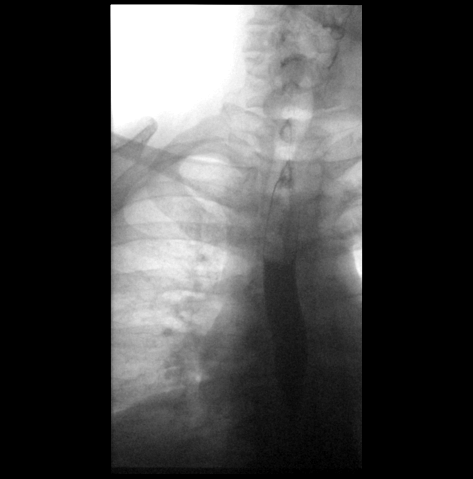
[frame 18/115]
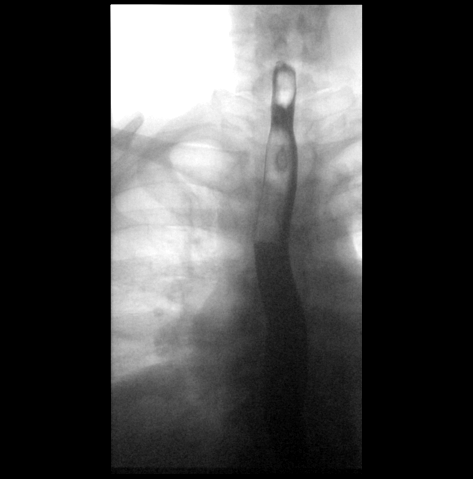
[frame 98/115]
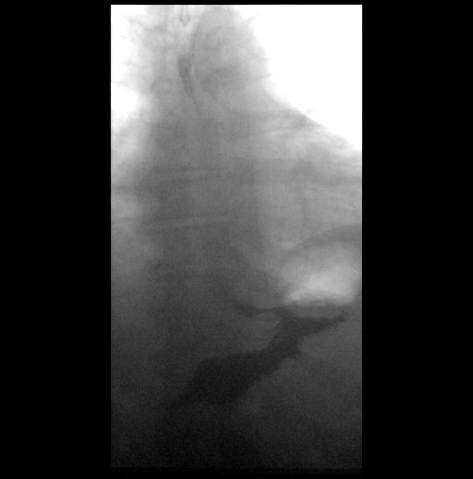

[Series 3: cp_standard · 0.34mm/px · 4 of 155 frames shown (2 of 3)]
[frame 24/155]
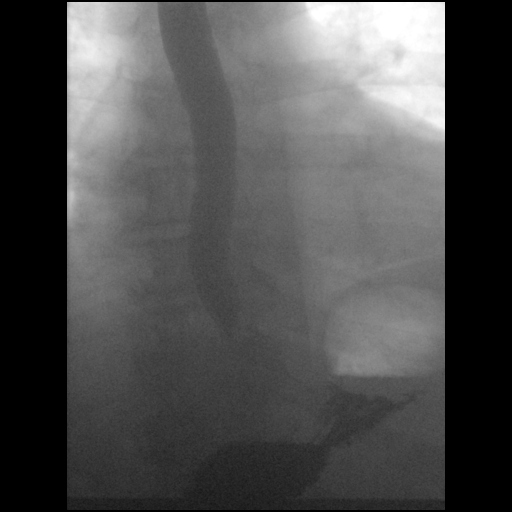
[frame 78/155]
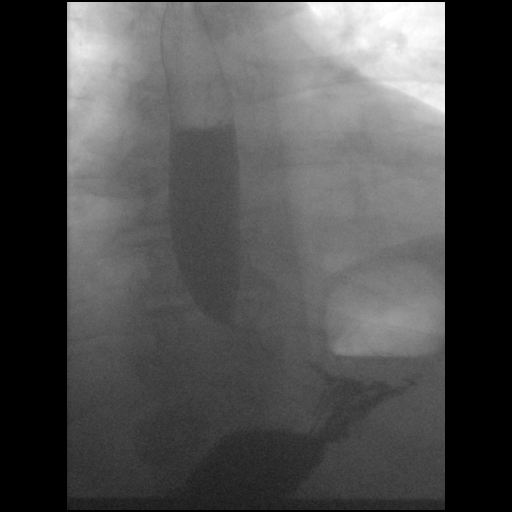
[frame 132/155]
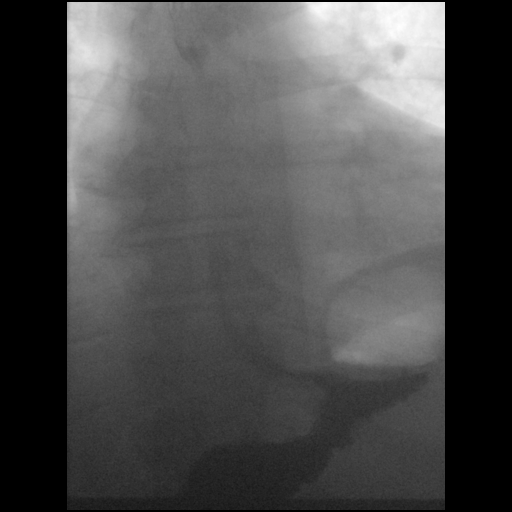
[frame 146/155]
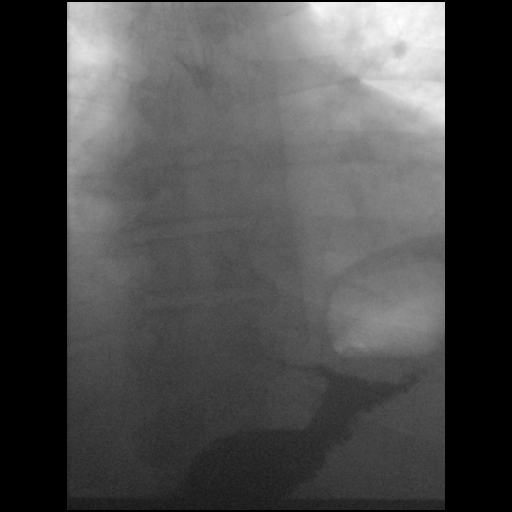

[Series 4: cp_standard · 0.52mm/px · 3 of 130 frames shown (3 of 3)]
[frame 20/130]
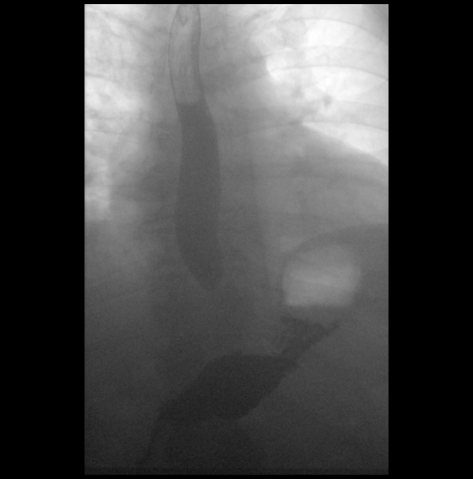
[frame 66/130]
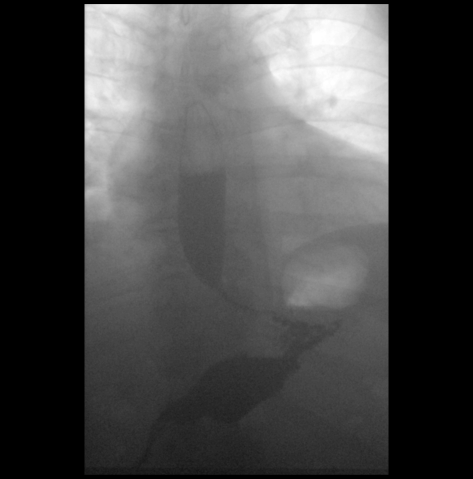
[frame 111/130]
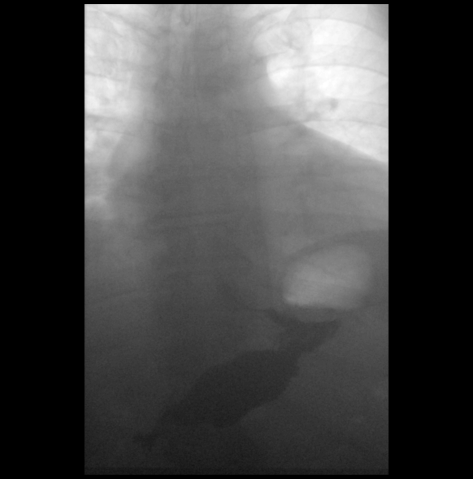

[Series 5: fluoro_barium 2fps_bw · 0.17mm/px · 1 of 1 slices shown (1 of 3)]
[im 1/1]
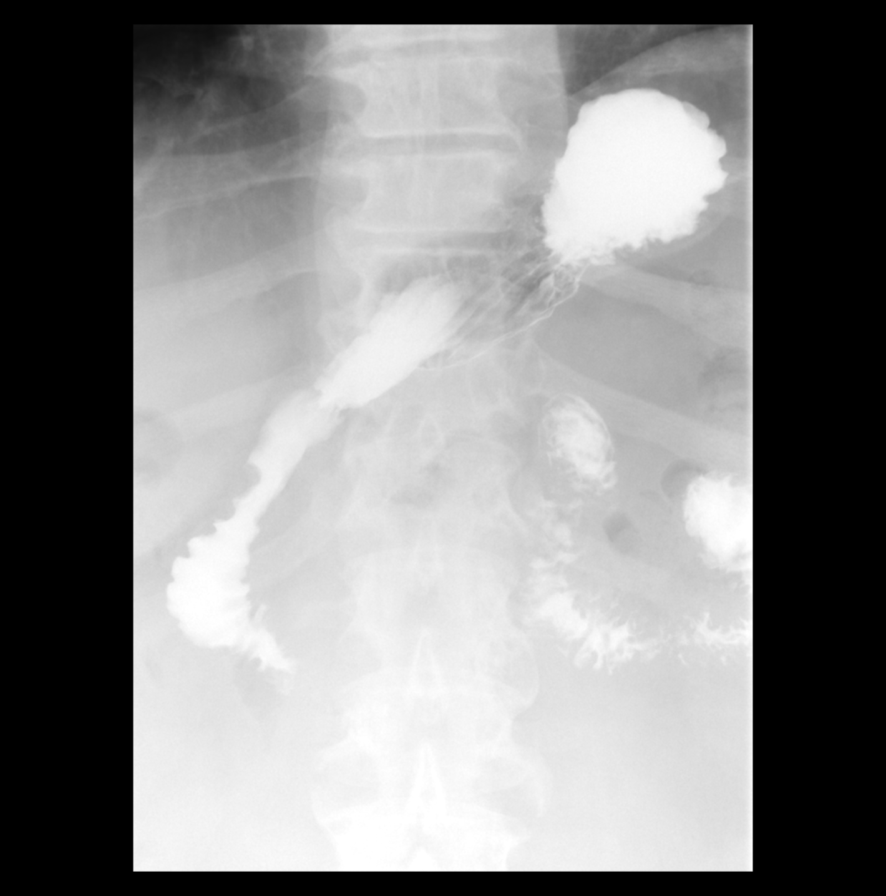

[Series 6: fluoro_barium 2fps_bw · 0.17mm/px · 1 of 1 slices shown (2 of 3)]
[im 1/1]
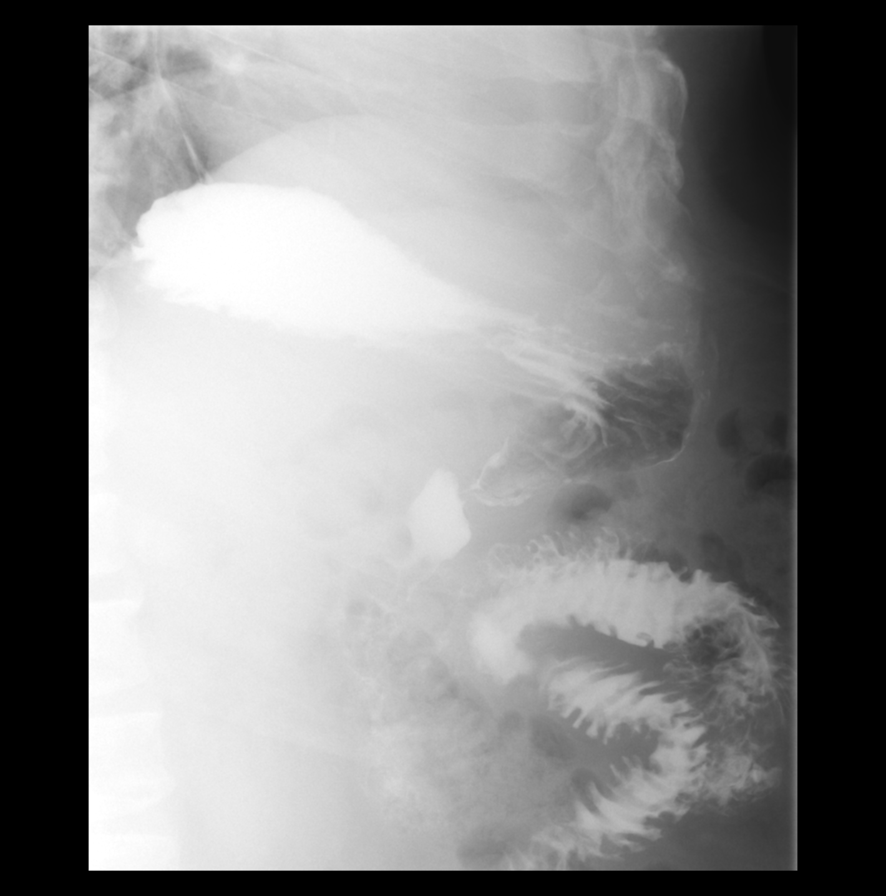

[Series 7: fluoro_barium 2fps_bw · 0.17mm/px · 1 of 1 slices shown (3 of 3)]
[im 1/1]
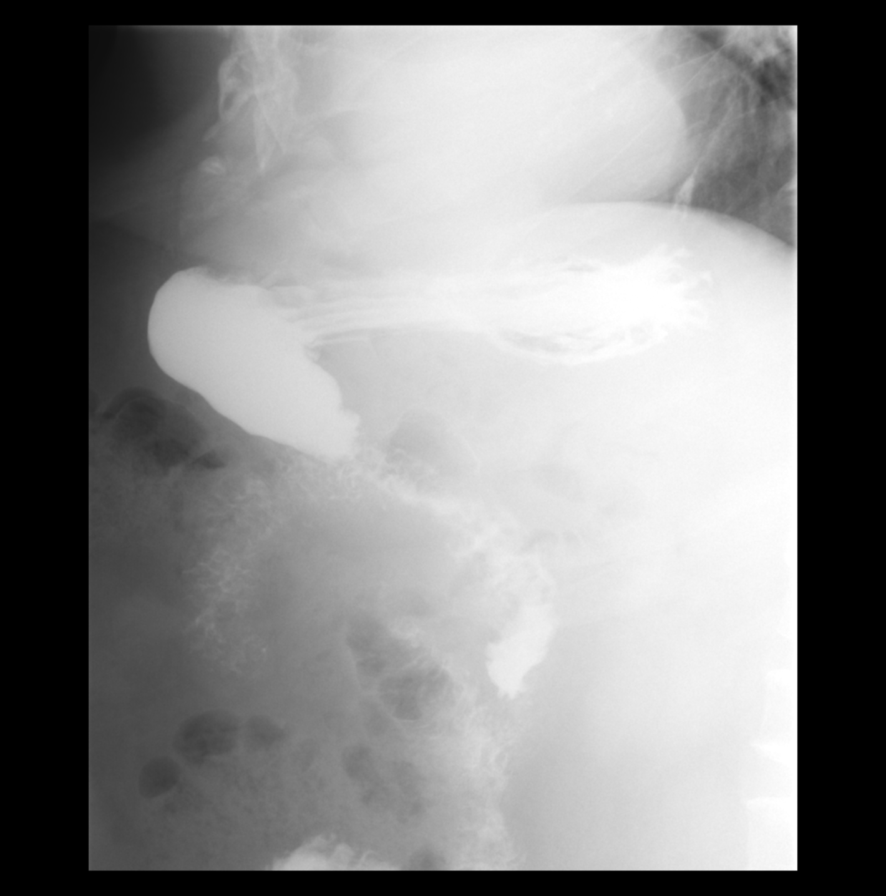

[14 of 16 positions shown; findings below may reference images not displayed]

FINDINGS: Preliminary KUB demonstrates a nonobstructive bowel gas pattern.

The pharyngeal phase of swallowing was normal. Primary peristaltic
waves in the esophagus were normal. No esophageal stricture,
ulceration, or other significant abnormality. Gastric morphology and
appearance appear normal. Proximal duodenum appears normal. No
hiatal hernia.

No gastroesophageal reflux was noted during the course of the
examination.
IMPRESSION: Normal upper GI.

## 2018-03-14 ENCOUNTER — Ambulatory Visit: Payer: Self-pay | Admitting: Skilled Nursing Facility1

## 2019-02-27 ENCOUNTER — Encounter (HOSPITAL_COMMUNITY): Payer: Self-pay

## 2022-01-19 ENCOUNTER — Encounter (HOSPITAL_COMMUNITY): Payer: Self-pay | Admitting: *Deleted

## 2023-01-25 ENCOUNTER — Encounter (HOSPITAL_COMMUNITY): Payer: Self-pay | Admitting: *Deleted
